# Patient Record
Sex: Female | Born: 1965
Health system: Southern US, Community
[De-identification: ages and names within clinical notes are randomized; demographics above are authoritative.]

## PROBLEM LIST (undated history)

## (undated) DIAGNOSIS — F32A Depression, unspecified: Secondary | ICD-10-CM

## (undated) DIAGNOSIS — M2550 Pain in unspecified joint: Secondary | ICD-10-CM

## (undated) DIAGNOSIS — M549 Dorsalgia, unspecified: Secondary | ICD-10-CM

## (undated) DIAGNOSIS — F419 Anxiety disorder, unspecified: Secondary | ICD-10-CM

## (undated) DIAGNOSIS — F329 Major depressive disorder, single episode, unspecified: Secondary | ICD-10-CM

## (undated) DIAGNOSIS — R609 Edema, unspecified: Secondary | ICD-10-CM

## (undated) HISTORY — DX: Edema, unspecified: R60.9

## (undated) HISTORY — PX: CYST REMOVAL HAND: SHX6279

## (undated) HISTORY — PX: LAPAROSCOPY: SHX197

## (undated) HISTORY — DX: Anxiety disorder, unspecified: F41.9

## (undated) HISTORY — DX: Depression, unspecified: F32.A

## (undated) HISTORY — DX: Pain in unspecified joint: M25.50

## (undated) HISTORY — DX: Major depressive disorder, single episode, unspecified: F32.9

## (undated) HISTORY — PX: ANKLE SURGERY: SHX546

## (undated) HISTORY — PX: TONSILLECTOMY AND ADENOIDECTOMY: SHX28

## (undated) HISTORY — DX: Dorsalgia, unspecified: M54.9

---

## 1999-02-18 ENCOUNTER — Other Ambulatory Visit: Admission: RE | Admit: 1999-02-18 | Discharge: 1999-02-18 | Payer: Self-pay | Admitting: Family Medicine

## 2005-05-25 ENCOUNTER — Emergency Department (HOSPITAL_COMMUNITY): Admission: EM | Admit: 2005-05-25 | Discharge: 2005-05-25 | Payer: Self-pay | Admitting: Emergency Medicine

## 2007-10-20 ENCOUNTER — Ambulatory Visit: Payer: Self-pay | Admitting: Obstetrics & Gynecology

## 2007-10-20 ENCOUNTER — Encounter: Payer: Self-pay | Admitting: Obstetrics & Gynecology

## 2007-11-15 ENCOUNTER — Ambulatory Visit: Payer: Self-pay | Admitting: Family Medicine

## 2007-11-15 DIAGNOSIS — J069 Acute upper respiratory infection, unspecified: Secondary | ICD-10-CM | POA: Insufficient documentation

## 2007-12-08 ENCOUNTER — Encounter: Admission: RE | Admit: 2007-12-08 | Discharge: 2007-12-08 | Payer: Self-pay | Admitting: Family Medicine

## 2008-10-24 ENCOUNTER — Other Ambulatory Visit: Admission: RE | Admit: 2008-10-24 | Discharge: 2008-10-24 | Payer: Self-pay | Admitting: Family Medicine

## 2008-10-24 ENCOUNTER — Ambulatory Visit: Payer: Self-pay | Admitting: Family Medicine

## 2008-10-24 ENCOUNTER — Encounter: Payer: Self-pay | Admitting: Family Medicine

## 2008-10-24 DIAGNOSIS — R5381 Other malaise: Secondary | ICD-10-CM | POA: Insufficient documentation

## 2008-10-24 DIAGNOSIS — R5383 Other fatigue: Secondary | ICD-10-CM

## 2008-10-24 DIAGNOSIS — L820 Inflamed seborrheic keratosis: Secondary | ICD-10-CM | POA: Insufficient documentation

## 2008-10-24 LAB — CONVERTED CEMR LAB
ALT: 11 units/L (ref 0–35)
AST: 19 units/L (ref 0–37)
Cholesterol: 155 mg/dL (ref 0–200)
Creatinine, Ser: 0.8 mg/dL (ref 0.40–1.20)
HDL: 39 mg/dL — ABNORMAL LOW (ref >39)
LDL Cholesterol: 99 mg/dL (ref 0–99)
MCHC: 32.6 g/dL (ref 30.0–36.0)
MCV: 92 fL (ref 78.0–100.0)
Platelets: 252 10*3/uL (ref 150–400)
RDW: 13.7 % (ref 11.5–15.5)
Sodium: 140 meq/L (ref 135–145)
TSH: 2.142 microintl units/mL (ref 0.350–4.50)
Total Bilirubin: 0.6 mg/dL (ref 0.3–1.2)
Total CHOL/HDL Ratio: 4
Total Protein: 6.8 g/dL (ref 6.0–8.3)
Triglycerides: 84 mg/dL (ref <150)
VLDL: 17 mg/dL (ref 0–40)

## 2008-10-25 ENCOUNTER — Encounter: Payer: Self-pay | Admitting: Family Medicine

## 2008-11-10 ENCOUNTER — Ambulatory Visit: Payer: Self-pay | Admitting: Family Medicine

## 2008-11-10 ENCOUNTER — Encounter: Payer: Self-pay | Admitting: Family Medicine

## 2008-11-10 DIAGNOSIS — D239 Other benign neoplasm of skin, unspecified: Secondary | ICD-10-CM | POA: Insufficient documentation

## 2009-01-09 ENCOUNTER — Ambulatory Visit: Payer: Self-pay | Admitting: Family Medicine

## 2009-01-09 ENCOUNTER — Encounter: Admission: RE | Admit: 2009-01-09 | Discharge: 2009-01-09 | Payer: Self-pay | Admitting: Family Medicine

## 2009-01-09 DIAGNOSIS — S335XXA Sprain of ligaments of lumbar spine, initial encounter: Secondary | ICD-10-CM | POA: Insufficient documentation

## 2009-12-03 ENCOUNTER — Telehealth: Payer: Self-pay | Admitting: Family Medicine

## 2009-12-03 ENCOUNTER — Other Ambulatory Visit: Admission: RE | Admit: 2009-12-03 | Discharge: 2009-12-03 | Payer: Self-pay | Admitting: Family Medicine

## 2009-12-03 ENCOUNTER — Ambulatory Visit: Payer: Self-pay | Admitting: Family Medicine

## 2009-12-03 DIAGNOSIS — R635 Abnormal weight gain: Secondary | ICD-10-CM | POA: Insufficient documentation

## 2009-12-03 DIAGNOSIS — N912 Amenorrhea, unspecified: Secondary | ICD-10-CM | POA: Insufficient documentation

## 2009-12-04 LAB — CONVERTED CEMR LAB
ALT: 13 units/L (ref 0–35)
Albumin: 4.4 g/dL (ref 3.5–5.2)
Alkaline Phosphatase: 44 units/L (ref 39–117)
Calcium: 8.9 mg/dL (ref 8.4–10.5)
Cholesterol: 199 mg/dL (ref 0–200)
LDL Cholesterol: 130 mg/dL — ABNORMAL HIGH (ref 0–99)
Potassium: 3.9 meq/L (ref 3.5–5.3)
TSH: 1.99 microintl units/mL (ref 0.350–4.500)
Total CHOL/HDL Ratio: 4.7
Total Protein: 7.2 g/dL (ref 6.0–8.3)
VLDL: 27 mg/dL (ref 0–40)

## 2010-01-14 ENCOUNTER — Encounter: Admission: RE | Admit: 2010-01-14 | Discharge: 2010-01-14 | Payer: Self-pay | Admitting: Family Medicine

## 2010-11-12 NOTE — Assessment & Plan Note (Signed)
Summary: CPE with pap   Vital Signs:  Patient profile:   45 year old female Menstrual status:  irregular LMP:     10/22/2009 Height:      64.25 inches Weight:      187 pounds BMI:     31.96 O2 Sat:      99 % on Room air Temp:     98.4 degrees F oral Pulse rate:   93 / minute BP sitting:   123 / 78  (left arm) Cuff size:   large  Vitals Entered By: Payton Spark CMA (December 03, 2009 8:29 AM)  O2 Flow:  Room air CC: CPE w/ pap LMP (date): 10/22/2009     Menstrual Status irregular Enter LMP: 10/22/2009 Last PAP Result NEGATIVE FOR INTRAEPITHELIAL LESIONS OR MALIGNANCY.   Primary Care Provider:  Seymour Bars DO  CC:  CPE w/ pap.  History of Present Illness: 45 yo WF presents for CPE with pap smear.  She has not been exercising since having to take care of her nephew in the afternoons.  She has gained 10 lbs.  She has her mammogram scheduled for April.  Her periods are a bit irregular -- occuring every 6 wks.  She is having vasomotor flushing.  Her cramps have been worse.  She is not on any contraception.  Her fasting labs are due.  Tetanus vaccine is UTD.   Married, nulligravid.       Allergies (verified): 1)  ! Codeine 2)  ! Pcn 3)  ! * Oxycontin 4)  ! Sulfa  Past History:  Past Medical History: Reviewed history from 10/24/2008 and no changes required. Unremarkable G0  Past Surgical History: Reviewed history from 11/15/2007 and no changes required. ankle surgery 2002  Family History: father CAD at 27, mother DM, high chol, HTN uncle with breast cancer, aunts with breast cancer brother healthy MGF colon cancer in his 66s Puncle with colon cancer  Social History: Reviewed history from 11/15/2007 and no changes required. 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 Has no children. Never smoked. Denies ETOH. Exercises 3 days a wk.    Review of Systems       The patient complains of weight gain.  The patient denies anorexia, fever, weight loss,  vision loss, decreased hearing, hoarseness, chest pain, syncope, dyspnea on exertion, peripheral edema, prolonged cough, headaches, hemoptysis, abdominal pain, melena, hematochezia, severe indigestion/heartburn, hematuria, incontinence, genital sores, muscle weakness, suspicious skin lesions, transient blindness, difficulty walking, depression, unusual weight change, abnormal bleeding, enlarged lymph nodes, angioedema, breast masses, and testicular masses.    Physical Exam  General:  alert, well-developed, well-nourished, and well-hydrated.  obese Head:  normocephalic and atraumatic.   Eyes:  PERRLA Ears:  clear effusion on the R, o/w both TMs normal Nose:  no nasal discharge.   Mouth:  good dentition and pharynx pink and moist.   Neck:  no masses.   Breasts:  No mass, nodules, thickening, tenderness, bulging, retraction, inflamation, nipple discharge or skin changes noted.   Lungs:  Normal respiratory effort, chest expands symmetrically. Lungs are clear to auscultation, no crackles or wheezes. Heart:  Normal rate and regular rhythm. S1 and S2 normal without gallop, murmur, click, rub or other extra sounds. Abdomen:  Bowel sounds positive,abdomen soft and non-tender without masses, organomegaly or hernias noted. Genitalia:  Pelvic Exam:        External: normal female genitalia without lesions or masses        Vagina: normal without lesions or  masses        Cervix: normal without lesions or masses, + nabothian cysts        Adnexa:grossly normal but unable to palpate specific parts due to body habitus.          Uterus: normal by palpation        Pap smear: performed Pulses:  2+ pedal and radial pulses Extremities:  no E/C/C Skin:  color normal.   Cervical Nodes:  No lymphadenopathy noted Psych:  good eye contact, not anxious appearing, and not depressed appearing.     Impression & Recommendations:  Problem # 1:  ROUTINE GYNECOLOGICAL EXAMINATION (ICD-V72.31) Thin prep pap smear  done. Keeping healthy checklist for women reviewed. BP at goal.  BMI 31 c/w class I obesity. U preg done for secondary amenorrhea --> neg, c/w perimenopausal status. GYN # given to discuss options for sterilization. Update fasting labs. Immunizations UTD. Work on [employer], regular exercise, wt loss. Mammogram scheduled for April.  Complete Medication List: 1)  Zyrtec Allergy 10 Mg Tabs (Cetirizine hcl) .... Take 1 tablet by mouth once a day  Other Orders: T-Comprehensive Metabolic Panel [phone]) T-Lipid Profile ([id number]) T-TSH [phone]) Urine Pregnancy ([id number])  Patient Instructions: 1)  Center for [facility]: 2)  [phone] 3)  Fasting labs today. 4)  Will call you w/ results tomorrow. 5)  Work on [employer], regular exercise. 6)  Return for f/u in 1 yr, sooner if needed.  Laboratory Results   Urine Tests      Urine HCG: negative

## 2010-11-12 NOTE — Progress Notes (Signed)
Summary: Nasal spray  Phone Note Call from Patient   Caller: Patient Summary of Call: Pt LMOM stating you mentioned a nasal spray you were going to send to pharm. Please advise Initial call taken by: Payton Spark CMA,  December 03, 2009 2:21 PM    New/Updated Medications: NASONEX 50 MCG/ACT SUSP (MOMETASONE FUROATE) 2 sprays per nostril daily Prescriptions: NASONEX 50 MCG/ACT SUSP (MOMETASONE FUROATE) 2 sprays per nostril daily  #1 bottle x 2   Entered and Authorized by:   Seymour Bars DO   Signed by:   Seymour Bars DO on 12/03/2009   Method used:   Electronically to        [facility]* (retail)       [address] [place], [place]  [address]       Ph: [phone]       Fax: [phone]   RxID:   [id number]   Appended Document: Nasal spray LMOM informing Pt

## 2011-02-25 NOTE — Assessment & Plan Note (Signed)
NAMEKIOSHA, BUCHAN NO.:  [id number]   MEDICAL RECORD NO.:  [id number]          PATIENT TYPE:  POB   LOCATION:  [facility] at [place]         FACILITY:  [facility]   PHYSICIAN:  Allie Bossier, MD        DATE OF BIRTH:  16-Jun-1966   DATE OF SERVICE:                                  CLINIC NOTE   SUBJECTIVE:  Ms. Pinnix is a 45 year old married, white gravida 0.  She was here for her annual exam.  She complains of heavy periods that  last 7 days total, 4 days are the heaviest.  Her cycles are every 14-20  days.  This is not unusual for her.  She reports that she has declined  to give blood because of anemia in the past.   PAST MEDICAL HISTORY:  1. Endometriosis.  2. Primary infertility.  3. Menorrhagia.  4. Obesity.   REVIEW OF SYSTEMS:  Cycles, as above.  Her husband uses condoms when  they have intercourse.  She has been married  for 22 years without  dyspareunia.  Her husband works out of town for [employer] and is gone for 6  weeks at a time.  Her mammogram was done in 2008 and it was normal.  Her  Pap smear was 09/2006 and it was normal.   PAST SURGICAL HISTORY:  Laparoscopy '95, along with a DNC.  A left ankle  surgery status post trauma with ORIF.   ALLERGIES:  No latex allergies.   MEDICATIONS ALLERGIES:  1. Penicillin.  2. Sulfa.  3. Codeine.   SOCIAL HISTORY:  She denies smoking or illegal drugs.  She rarely drinks  alcohol.   FAMILY HISTORY:  Significant for colon cancer in a maternal grandfather  and breast cancer in 2 maternal aunt's and 1 paternal aunt.   PHYSICAL EXAMINATION:  VITAL SIGNS:  Weight 175 pounds, height 5, 2.  BMI 32.  Blood pressure 126/80, pulse 77, temperature 97.5.  HEENT:  Normal.  HEART:  Regular rate and rhythm.  LUNGS:  Clear to auscultation bilaterally.  BREASTS:  Exam normal.  No masses or nipple discharge or skin changes.  ABDOMINAL:  Benign.  EXTERNAL GENITALIA:  Normal.  No lesions.  Cervix:  No leprous, no  lesions.   Normal discharge.  Uterus:  Normal size and shape,  retroverted.  Minimally mobile, nontender.  Adnexa nontender.  No  enlargement.   ASSESSMENT/PLAN:  1. Annual exam.  I have checked a Pap smear and run cervical cultures      with this per [employer] [facility]      recommendations.  She will have a mammogram today.  Recommended      self-breasts and self-vulvar exams monthly.  2. Obesity.  We decreased weight loss and exercise.  I will check a      fasting lipid profile and a fasting blood sugar today.  3. With menorrhagia will check a TSH and a CBC.   Please note, she has an appointment with family practice doctor, Dr.  Cathey Endow, in several weeks.      Allie Bossier, MD     MCD/MEDQ  D:  10/20/2007  T:  10/20/2007  Job:  [id number]

## 2011-12-16 ENCOUNTER — Other Ambulatory Visit: Payer: Self-pay | Admitting: Obstetrics and Gynecology

## 2013-09-12 ENCOUNTER — Other Ambulatory Visit: Payer: Self-pay | Admitting: Obstetrics and Gynecology

## 2014-09-15 ENCOUNTER — Other Ambulatory Visit: Payer: Self-pay | Admitting: Obstetrics and Gynecology

## 2014-09-18 LAB — CYTOLOGY - PAP

## 2014-09-19 ENCOUNTER — Other Ambulatory Visit: Payer: Self-pay | Admitting: Obstetrics and Gynecology

## 2014-09-19 DIAGNOSIS — R928 Other abnormal and inconclusive findings on diagnostic imaging of breast: Secondary | ICD-10-CM

## 2014-10-10 ENCOUNTER — Ambulatory Visit
Admission: RE | Admit: 2014-10-10 | Discharge: 2014-10-10 | Disposition: A | Discharge status: Home / Self Care | Payer: BC Managed Care – PPO | Source: Ambulatory Visit | Attending: Obstetrics and Gynecology | Admitting: Obstetrics and Gynecology

## 2014-10-10 ENCOUNTER — Encounter (INDEPENDENT_AMBULATORY_CARE_PROVIDER_SITE_OTHER): Payer: Self-pay

## 2014-10-10 DIAGNOSIS — R928 Other abnormal and inconclusive findings on diagnostic imaging of breast: Secondary | ICD-10-CM

## 2015-09-30 IMAGING — US US BREAST*L* LIMITED INC AXILLA
1 series · 11 of 11 positions shown · non-contrast
Comparison: Previous examinations, including the screening
mammogram dated 09/15/2014.

CLINICAL DATA: Possible upper left breast mass at recent screening
mammography. The patient began taking estrogen 1 year ago.

EXAM:
DIGITAL DIAGNOSTIC  LEFT MAMMOGRAM
ULTRASOUND LEFT BREAST

[Series 1: us breast*left* limited inc axilla · 11 of 11 slices shown]
[im 1/11]
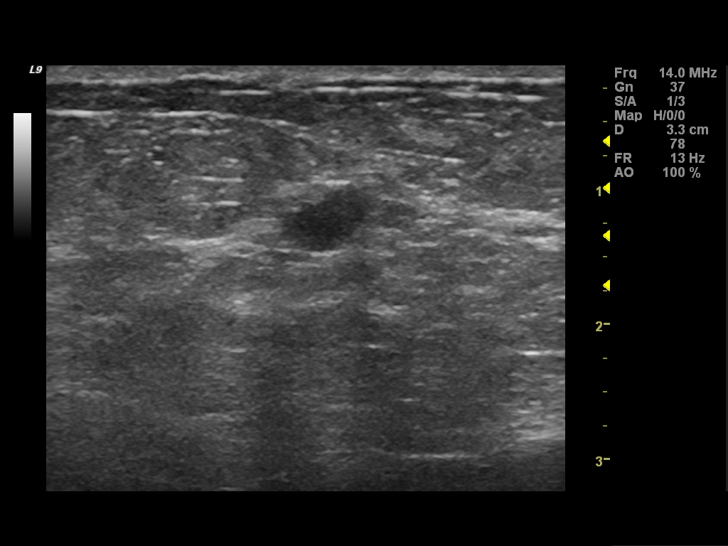
[im 2/11]
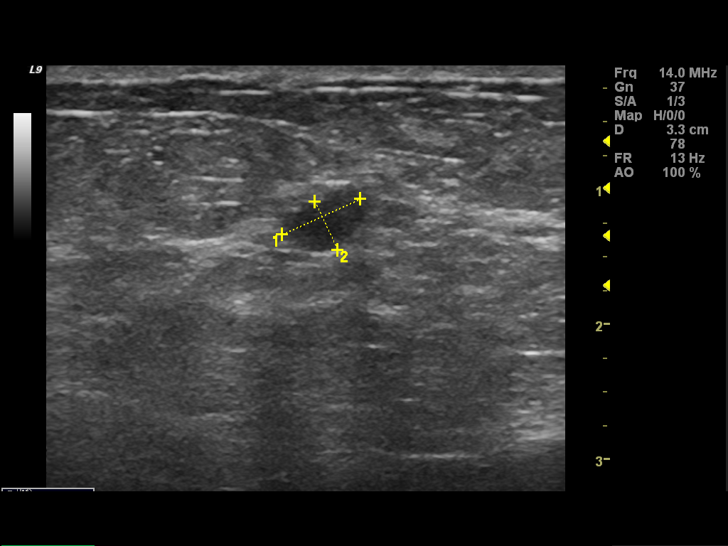
[im 3/11]
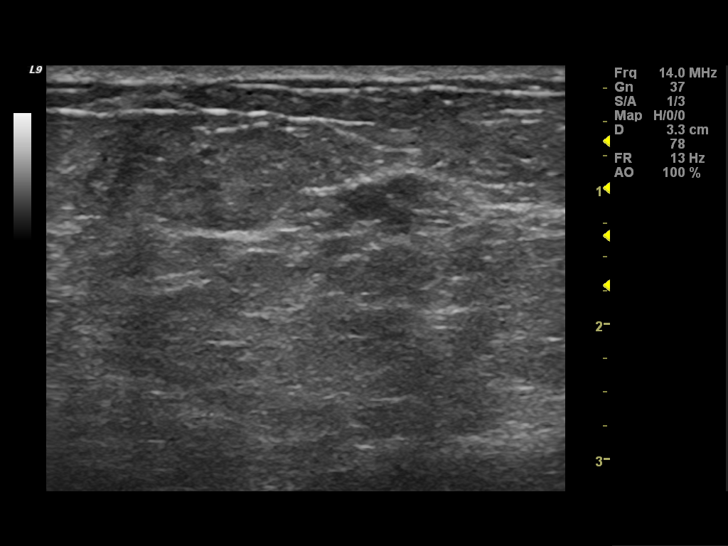
[im 4/11]
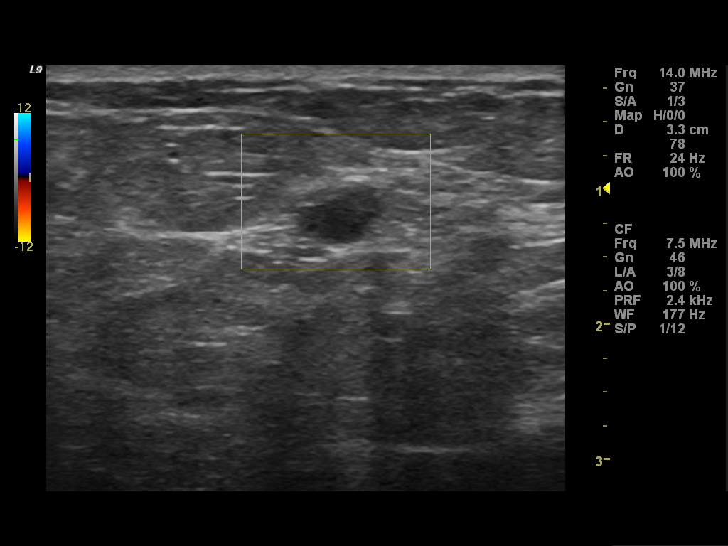
[im 5/11]
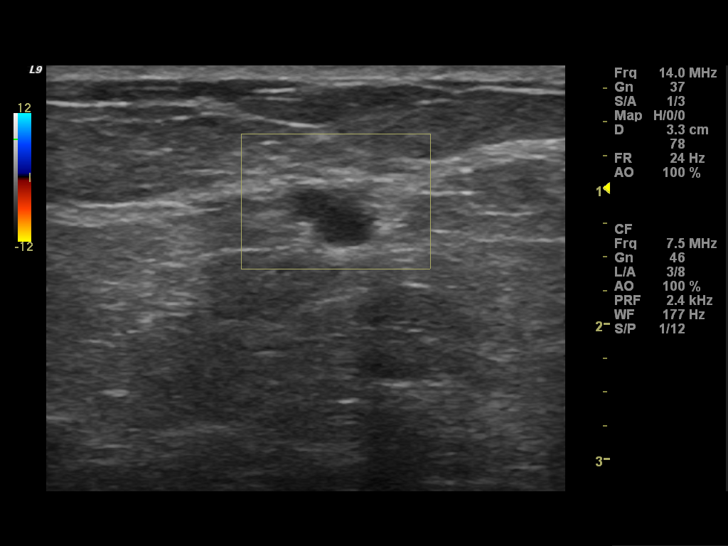
[im 6/11]
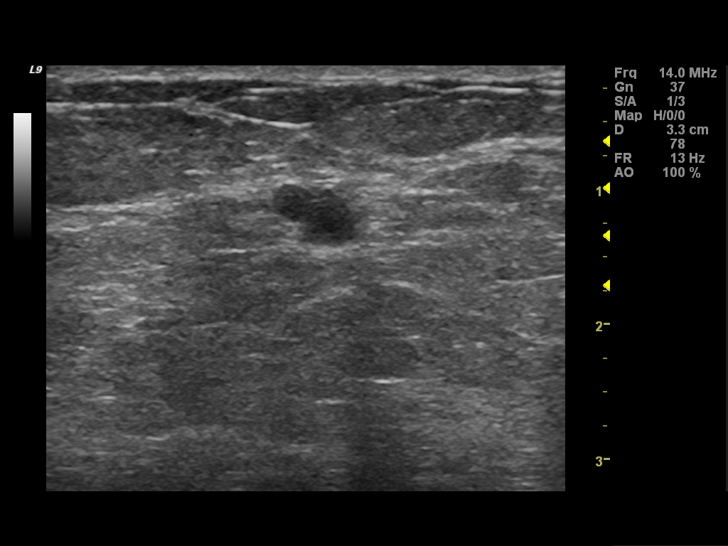
[im 7/11]
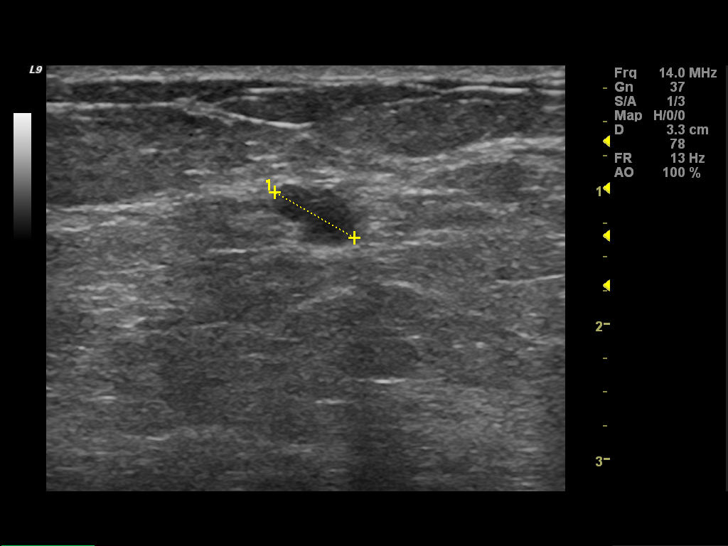
[im 8/11]
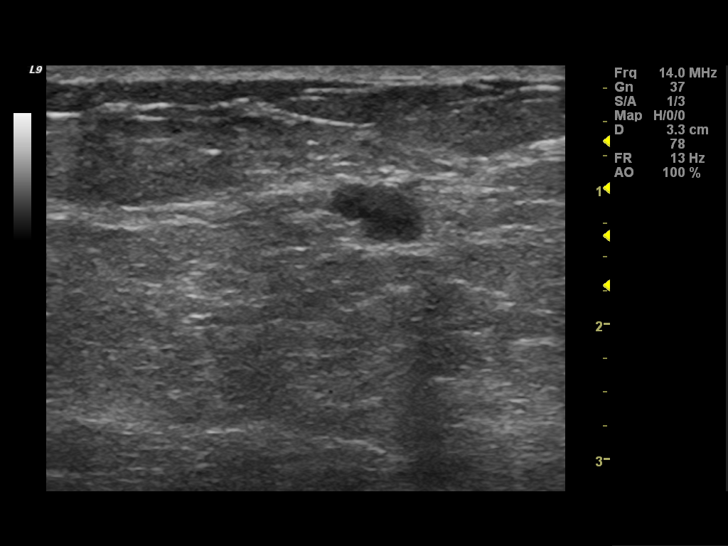
[im 9/11]
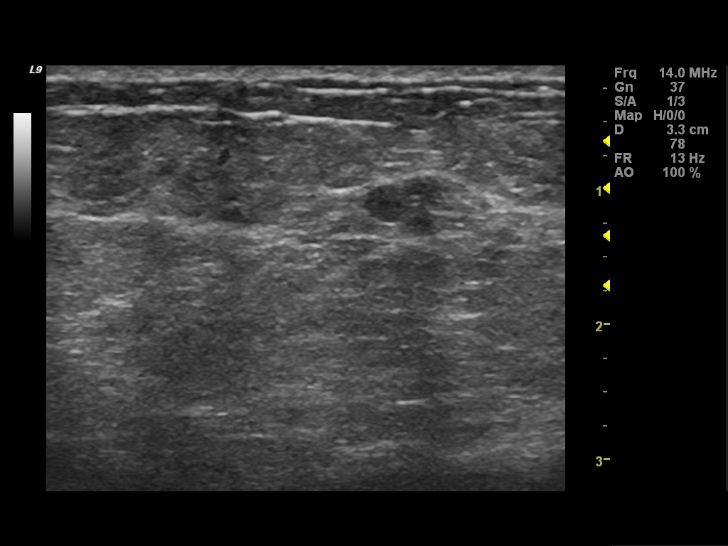
[im 10/11]
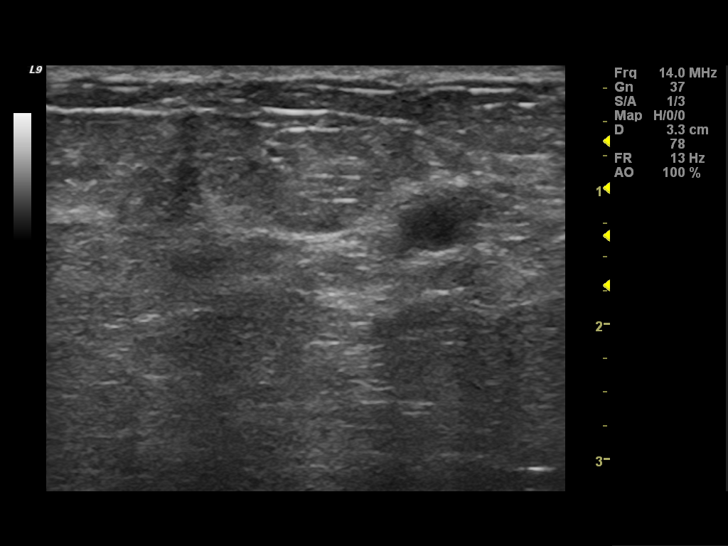
[im 11/11]
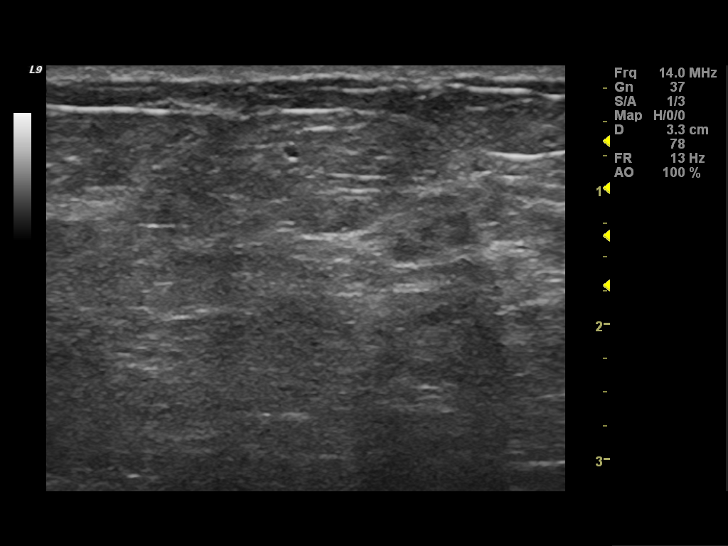

[11 of 11 positions shown; findings below may reference images not displayed]

ACR Breast Density Category b: There are scattered areas of
fibroglandular density.
FINDINGS: Spot compression views of the left breast confirm an oval,
circumscribed mass in the posterior aspect of the upper left breast
in the 12 o'clock position.

On physical exam, no mass is palpable in the upper left breast.

Ultrasound is performed, showing a 7 mm trilobed cyst in the 11:30
o'clock position of the left breast, 7 cm from the nipple.
IMPRESSION: Left breast cyst.  No evidence of malignancy.

RECOMMENDATION:
Bilateral screening mammogram in 1 year.

I have discussed the findings and recommendations with the patient.
Results were also provided in writing at the conclusion of the
visit. If applicable, a reminder letter will be sent to the patient
regarding the next appointment.

BI-RADS CATEGORY  2: Benign.

## 2016-05-07 DIAGNOSIS — L7211 Pilar cyst: Secondary | ICD-10-CM | POA: Diagnosis not present

## 2016-05-07 DIAGNOSIS — D1801 Hemangioma of skin and subcutaneous tissue: Secondary | ICD-10-CM | POA: Diagnosis not present

## 2016-05-07 DIAGNOSIS — L723 Sebaceous cyst: Secondary | ICD-10-CM | POA: Diagnosis not present

## 2016-05-07 DIAGNOSIS — L821 Other seborrheic keratosis: Secondary | ICD-10-CM | POA: Diagnosis not present

## 2016-05-07 DIAGNOSIS — L918 Other hypertrophic disorders of the skin: Secondary | ICD-10-CM | POA: Diagnosis not present

## 2016-09-18 ENCOUNTER — Encounter (INDEPENDENT_AMBULATORY_CARE_PROVIDER_SITE_OTHER): Payer: Self-pay | Admitting: Family Medicine

## 2016-10-09 ENCOUNTER — Encounter (INDEPENDENT_AMBULATORY_CARE_PROVIDER_SITE_OTHER): Payer: Self-pay | Admitting: Family Medicine

## 2016-10-09 ENCOUNTER — Ambulatory Visit (INDEPENDENT_AMBULATORY_CARE_PROVIDER_SITE_OTHER): Payer: 59 | Admitting: Family Medicine

## 2016-10-09 VITALS — BP 149/89 | HR 81 | Temp 98.3°F | Resp 13 | Ht 63.5 in | Wt 205.0 lb

## 2016-10-09 DIAGNOSIS — R5383 Other fatigue: Secondary | ICD-10-CM | POA: Diagnosis not present

## 2016-10-09 DIAGNOSIS — R0602 Shortness of breath: Secondary | ICD-10-CM

## 2016-10-09 DIAGNOSIS — Z1331 Encounter for screening for depression: Secondary | ICD-10-CM

## 2016-10-09 DIAGNOSIS — R030 Elevated blood-pressure reading, without diagnosis of hypertension: Secondary | ICD-10-CM

## 2016-10-09 DIAGNOSIS — Z6836 Body mass index (BMI) 36.0-36.9, adult: Secondary | ICD-10-CM | POA: Diagnosis not present

## 2016-10-09 DIAGNOSIS — E669 Obesity, unspecified: Secondary | ICD-10-CM

## 2016-10-09 DIAGNOSIS — Z1389 Encounter for screening for other disorder: Secondary | ICD-10-CM | POA: Diagnosis not present

## 2016-10-09 DIAGNOSIS — Z9189 Other specified personal risk factors, not elsewhere classified: Secondary | ICD-10-CM | POA: Diagnosis not present

## 2016-10-09 DIAGNOSIS — Z0289 Encounter for other administrative examinations: Secondary | ICD-10-CM

## 2016-10-09 NOTE — Progress Notes (Signed)
Office: [phone]  /  Fax: [phone]   HPI:   Chief Complaint: OBESITY  Kristi Huynh (MR# ID:[id number]) is a 50 y.o. female who presents on 10/09/2016 for obesity evaluation and treatment. Current BMI is Body mass index is 35.74 kg/m.Kristi Huynh [name] has struggled with obesity for years and has been unsuccessful in either losing weight or maintaining long term weight loss. Kristi Huynh attended our information session and states she is currently in the action stage of change and ready to dedicate time achieving and maintaining a healthier weight.  Kristi Huynh states her family eats meals together she thinks her family will eat healthier with  her her desired weight is 130 she started gaining weight 2 years after no menses her heaviest weight ever was 209 lbs. she has significant food cravings issues  she snacks frequently in the evenings she frequently makes poor food choices she frequently eats larger portions than normal  she has binge eating behaviors she struggles with emotional eating    Fatigue Kristi Huynh feels her energy is lower than it should be. This has worsened with weight gain and has not worsened recently. Kristi Huynh admits to daytime somnolence and  admits to waking up still tired. Patient is at risk for obstructive sleep apnea. Patent has a history of symptoms of daytime fatigue and Epworth sleepiness scale. Patient generally gets 7 or 8 hours of sleep per night, and states they generally have restless sleep. Snoring is present. Apneic episodes are not present. Epworth Sleepiness Score is 11  Dyspnea on exertion Kristi Huynh notes increasing shortness of breath with exercising and seems to be worsening over time with weight gain. She notes getting out of breath sooner with activity than she used to. This has not gotten worse recently. Kristi Huynh denies orthopnea.  Elevated Blood Pressure Kristi Huynh has elevated blood pressure today, no history of hypertension, denies chest pain. She feels her BP may be increasing  with her weight gain.  Depression Screen [name] Food and Mood (modified PHQ-9) score was  Depression screen PHQ 2/9 10/09/2016  Decreased Interest 3  Down, Depressed, Hopeless 2  PHQ - 2 Score 5  Altered sleeping 2  Tired, decreased energy 3  Change in appetite 3  Feeling bad or failure about yourself  2  Trouble concentrating 2  Moving slowly or fidgety/restless 1  Suicidal thoughts 0  PHQ-9 Score 18    ALLERGIES: Allergies  Allergen Reactions  . Codeine   . Oxycodone Hcl   . Penicillins   . Sulfonamide Derivatives     MEDICATIONS: No current outpatient prescriptions on file prior to visit.   No current facility-administered medications on file prior to visit.     PAST MEDICAL HISTORY: Past Medical History:  Diagnosis Date  . Anxiety   . back pain   . Depression   . joint pain   . Swelling    feet and legs    PAST SURGICAL HISTORY: Past Surgical History:  Procedure Laterality Date  . ANKLE SURGERY     Right 2 times, once as teen and once at age 34  . CYST REMOVAL HAND     Right hand age 28 and 10  . LAPAROSCOPY     endometrosis  . TONSILLECTOMY AND ADENOIDECTOMY     50 years old    SOCIAL HISTORY: Social History  Substance Use Topics  . Smoking status: Never Smoker  . Smokeless tobacco: Never Used  . Alcohol use Not on file    FAMILY HISTORY: Family History  Problem  Relation Age of Onset  . Diabetes Mother   . Hypertension Mother   . Hyperlipidemia Mother   . Thyroid disease Mother   . Depression Mother   . Anxiety disorder Mother   . Eating disorder Mother   . Obesity Mother   . Heart disease Father   . Sudden death Father     ROS: Review of Systems  Constitutional: Positive for malaise/fatigue.  HENT: Positive for sinus pain.   Eyes: Positive for redness.       Wear Glasses or contacts  Respiratory: Positive for cough and wheezing.   Cardiovascular:       Calf/Leg Pain with Walking  Musculoskeletal: Positive for back pain,  joint pain and myalgias.  Psychiatric/Behavioral: The patient has insomnia.        Stress    PHYSICAL EXAM: Blood pressure (!) 149/89, pulse 81, temperature 98.3 F (36.8 C), temperature source Oral, resp. rate 13, height 5' 3.5" (1.613 m), weight 205 lb (93 kg), last menstrual period 09/12/2014, SpO2 96 %. Body mass index is 35.74 kg/m. Physical Exam  Constitutional: She is oriented to person, place, and time. She appears well-developed and well-nourished.  HENT:  Head: Normocephalic and atraumatic.  Nose: Nose normal.  Eyes: EOM are normal. No scleral icterus.  Neck: Normal range of motion. Neck supple. No thyromegaly present.  Pulmonary/Chest: No respiratory distress.  Abdominal: Soft. There is no tenderness.  Obesity  Musculoskeletal: Normal range of motion. She exhibits edema.  Neurological: She is alert and oriented to person, place, and time.  Skin: Skin is warm and dry.  Psychiatric: She has a normal mood and affect. Her behavior is normal.    RECENT LABS AND TESTS: BMET    Component Value Date/Time   NA 140 12/03/2009 2056   K 3.9 12/03/2009 2056   CL 104 12/03/2009 2056   CO2 25 12/03/2009 2056   GLUCOSE 93 12/03/2009 2056   BUN 11 12/03/2009 2056   CREATININE 0.76 12/03/2009 2056   CALCIUM 8.9 12/03/2009 2056   No results found for: HGBA1C No results found for: INSULIN CBC    Component Value Date/Time   WBC 6.2 10/24/2008 2117   RBC 4.64 10/24/2008 2117   HGB 13.9 10/24/2008 2117   HCT 42.7 10/24/2008 2117   PLT 252 10/24/2008 2117   MCV 92.0 10/24/2008 2117   MCHC 32.6 10/24/2008 2117   RDW 13.7 10/24/2008 2117   Iron/TIBC/Ferritin/ %Sat No results found for: IRON, TIBC, FERRITIN, IRONPCTSAT Lipid Panel     Component Value Date/Time   CHOL 199 12/03/2009 2056   TRIG 134 12/03/2009 2056   HDL 42 12/03/2009 2056   CHOLHDL 4.7 Ratio 12/03/2009 2056   VLDL 27 12/03/2009 2056   LDLCALC 130 (H) 12/03/2009 2056   Hepatic Function Panel       Component Value Date/Time   PROT 7.2 12/03/2009 2056   ALBUMIN 4.4 12/03/2009 2056   AST 23 12/03/2009 2056   ALT 13 12/03/2009 2056   ALKPHOS 44 12/03/2009 2056   BILITOT 0.6 12/03/2009 2056      Component Value Date/Time   TSH 1.990 12/03/2009 2056   TSH 2.142 10/24/2008 2117    ECG  shows NSR with a rate of 81 BPM INDIRECT CALORIMETER done today shows a VO2 of 233 and a REE of 1623.    ASSESSMENT AND PLAN: Other fatigue - Plan: EKG 12-Lead, Vitamin B12, Comprehensive metabolic panel, Hemoglobin A1c, Insulin, random, T4, free, T3, TSH, VITAMIN D 25 Hydroxy (Vit-D  Deficiency, Fractures), Folate, Lipid Panel With LDL/HDL Ratio, CBC With Differential  Shortness of breath on exertion - Plan: Lipid Panel With LDL/HDL Ratio  Elevated blood pressure reading without diagnosis of hypertension  At risk for heart disease  Depression screening  Class 2 obesity without serious comorbidity with body mass index (BMI) of 36.0 to 36.9 in adult, unspecified obesity type  PLAN:  Fatigue Jaielle was informed that her fatigue may be related to obesity, depression or many other causes. Labs will be ordered, and in the meanwhile Alonie has agreed to work on diet, exercise and weight loss to help with fatigue. Proper sleep hygiene was discussed including the need for 7-8 hours of quality sleep each night. A sleep study was not ordered based on symptoms and Epworth score.  Exercise Induced Shortness of Breath Milena's shortness of breath appears to be obesity related and exercise induced. She has agreed to work on weight loss and gradually increase exercise to treat her exercise induced shortness of breath. If Naveh follows our instructions and loses weight without improvement of her shortness of breath, we will plan to refer to pulmonology. We will monitor this condition regularly. Rachella agrees to this plan.  Elevated Blood Pressure  Kimora agrees to check blood pressure at home daily and bring in  log to next visit.  Cardiovascular risk counselling Vannie was given extended (at least 15 minutes) coronary artery disease prevention counseling today. She is 50 y.o. female and has risk factors for heart disease including obesity. We discussed intensive lifestyle modifications today with an emphasis on specific weight loss instructions and strategies. Pt was also informed of the importance of increasing exercise and decreasing saturated fats to help prevent heart disease.  Depression Screen Syah had a positive depression screening. Depression is commonly associated with obesity and often results in emotional eating behaviors. We will monitor this closely and work on CBT to help improve the non-hunger eating patterns. Referral to Psychology may be required if no improvement is seen as she continues in our clinic.  Obesity Taeya is currently in the action stage of change and her goal is to continue with weight loss efforts She has agreed to follow the Category 2 plan +100 Calories. Trinh has been instructed to work up to a goal of 150 minutes of combined cardio and strengthening exercise per week for weight loss and overall health benefits. We discussed the following Behavioral Modification Stratagies today: increasing lean protein intake, decreasing sodium intake, work on meal planning and easy cooking plans and emotional eating strategies  Kinzlie has agreed to follow up with our clinic in 2 weeks. She was informed of the importance of frequent follow up visits to maximize her success with intensive lifestyle modifications for her multiple health conditions. She was informed we would discuss her lab results at her next visit unless there is a critical issue that needs to be addressed sooner. Damani agreed to keep her next visit at the agreed upon time to discuss these results.  I, Doreene Nest, am acting as scribe for Dennard Nip, MD  I have reviewed the above documentation for accuracy and  completeness, and I agree with the above. -Dennard Nip, MD

## 2016-10-10 LAB — FOLATE: Folate: >20 ng/mL (ref >3.0)

## 2016-10-10 LAB — HEMOGLOBIN A1C
Est. average glucose Bld gHb Est-mCnc: 108 mg/dL
HEMOGLOBIN A1C: 5.4 % (ref 4.8–5.6)

## 2016-10-10 LAB — T4, FREE: FREE T4: 1.09 ng/dL (ref 0.82–1.77)

## 2016-10-10 LAB — COMPREHENSIVE METABOLIC PANEL
ALBUMIN: 4.5 g/dL (ref 3.5–5.5)
ALT: 10 IU/L (ref 0–32)
AST: 22 IU/L (ref 0–40)
Albumin/Globulin Ratio: 1.3 (ref 1.2–2.2)
Alkaline Phosphatase: 63 IU/L (ref 39–117)
BUN / CREAT RATIO: 10 (ref 9–23)
BUN: 8 mg/dL (ref 6–24)
Bilirubin Total: 0.5 mg/dL (ref 0.0–1.2)
CALCIUM: 9.3 mg/dL (ref 8.7–10.2)
CO2: 23 mmol/L (ref 18–29)
CREATININE: 0.81 mg/dL (ref 0.57–1.00)
Chloride: 98 mmol/L (ref 96–106)
GFR calc non Af Amer: 84 mL/min/{1.73_m2} (ref >59)
GFR, EST AFRICAN AMERICAN: 97 mL/min/{1.73_m2} (ref >59)
GLUCOSE: 88 mg/dL (ref 65–99)
Globulin, Total: 3.4 g/dL (ref 1.5–4.5)
Potassium: 4.1 mmol/L (ref 3.5–5.2)
Sodium: 139 mmol/L (ref 134–144)
TOTAL PROTEIN: 7.9 g/dL (ref 6.0–8.5)

## 2016-10-10 LAB — CBC WITH DIFFERENTIAL
BASOS ABS: 0.1 10*3/uL (ref 0.0–0.2)
BASOS: 1 %
EOS (ABSOLUTE): 0.1 10*3/uL (ref 0.0–0.4)
Eos: 1 %
Hematocrit: 42.4 % (ref 34.0–46.6)
Hemoglobin: 14.4 g/dL (ref 11.1–15.9)
Immature Grans (Abs): 0 10*3/uL (ref 0.0–0.1)
Immature Granulocytes: 0 %
LYMPHS ABS: 2.8 10*3/uL (ref 0.7–3.1)
Lymphs: 32 %
MCH: 29.8 pg (ref 26.6–33.0)
MCHC: 34 g/dL (ref 31.5–35.7)
MCV: 88 fL (ref 79–97)
Monocytes Absolute: 0.6 10*3/uL (ref 0.1–0.9)
Monocytes: 6 %
NEUTROS ABS: 5.2 10*3/uL (ref 1.4–7.0)
NEUTROS PCT: 60 %
RBC: 4.84 x10E6/uL (ref 3.77–5.28)
RDW: 13.7 % (ref 12.3–15.4)
WBC: 8.8 10*3/uL (ref 3.4–10.8)

## 2016-10-10 LAB — LIPID PANEL WITH LDL/HDL RATIO
CHOLESTEROL TOTAL: 223 mg/dL — AB (ref 100–199)
HDL: 39 mg/dL — AB (ref >39)
LDL CALC: 139 mg/dL — AB (ref 0–99)
LDl/HDL Ratio: 3.6 ratio units — ABNORMAL HIGH (ref 0.0–3.2)
TRIGLYCERIDES: 227 mg/dL — AB (ref 0–149)
VLDL CHOLESTEROL CAL: 45 mg/dL — AB (ref 5–40)

## 2016-10-10 LAB — VITAMIN B12: Vitamin B-12: 936 pg/mL (ref 232–1245)

## 2016-10-10 LAB — VITAMIN D 25 HYDROXY (VIT D DEFICIENCY, FRACTURES): Vit D, 25-Hydroxy: 22.6 ng/mL — ABNORMAL LOW (ref 30.0–100.0)

## 2016-10-10 LAB — T3: T3 TOTAL: 127 ng/dL (ref 71–180)

## 2016-10-10 LAB — TSH: TSH: 1.97 u[IU]/mL (ref 0.450–4.500)

## 2016-10-10 LAB — INSULIN, RANDOM: INSULIN: 14.4 u[IU]/mL (ref 2.6–24.9)

## 2016-10-22 ENCOUNTER — Ambulatory Visit (INDEPENDENT_AMBULATORY_CARE_PROVIDER_SITE_OTHER): Payer: 59 | Admitting: Family Medicine

## 2016-10-22 VITALS — BP 137/74 | HR 78 | Temp 98.7°F | Ht 63.0 in | Wt 202.0 lb

## 2016-10-22 DIAGNOSIS — E559 Vitamin D deficiency, unspecified: Secondary | ICD-10-CM

## 2016-10-22 DIAGNOSIS — Z9189 Other specified personal risk factors, not elsewhere classified: Secondary | ICD-10-CM | POA: Diagnosis not present

## 2016-10-22 DIAGNOSIS — E8881 Metabolic syndrome: Secondary | ICD-10-CM

## 2016-10-22 DIAGNOSIS — Z6835 Body mass index (BMI) 35.0-35.9, adult: Secondary | ICD-10-CM | POA: Diagnosis not present

## 2016-10-22 DIAGNOSIS — E669 Obesity, unspecified: Secondary | ICD-10-CM | POA: Diagnosis not present

## 2016-10-22 DIAGNOSIS — K5900 Constipation, unspecified: Secondary | ICD-10-CM

## 2016-10-22 MED ORDER — VITAMIN D (ERGOCALCIFEROL) 1.25 MG (50000 UNIT) PO CAPS: 50000.0000 [IU] | ORAL_CAPSULE | ORAL | 0 refills | Status: DC

## 2016-10-22 MED ORDER — METFORMIN HCL 500 MG PO TABS: 500.0000 mg | ORAL_TABLET | Freq: Every day | ORAL | 0 refills | Status: DC

## 2016-10-22 MED ORDER — BENEFIBER PO POWD: 1.0000 | Freq: Two times a day (BID) | ORAL | 0 refills | Status: DC

## 2016-10-22 MED FILL — VIT D2 1.25 MG (50,000 UNIT: 1.25 MG | 7 days supply | Qty: 4 | Fill #0

## 2016-10-22 MED FILL — metFORMIN HCL 500 MG TABS: 500 | 30 days supply | Qty: 30 | Fill #0

## 2016-10-23 ENCOUNTER — Encounter (INDEPENDENT_AMBULATORY_CARE_PROVIDER_SITE_OTHER): Payer: Self-pay | Admitting: Family Medicine

## 2016-10-23 NOTE — Progress Notes (Signed)
Office: [phone]  /  Fax: [phone]   HPI:   Chief Complaint: OBESITY Kristi Huynh is here to discuss her progress with her obesity treatment plan. She is following her eating plan approximately 75 % of the time and states she is exercising 45 minutes 3 times per week. Kristi Huynh is currently struggling with her eating plan at dinner. Kristi Huynh did well with weight loss, did well in morning and at lunch. Her weight is (P) 202 lb (91.6 kg) today and has had a weight loss of 3 pounds over a period of 2 weeks since her last visit. She has lost 3 lbs since starting treatment with [name].  Vitamin D deficiency Kristi Huynh has a diagnosis of vitamin D deficiency. She is not currently taking vit D, and denies nausea, vomiting or muscle weakness. Positive fatigue.  Insulin Resistance Kristi Huynh has a diagnosis of insulin resistance based on her elevated fasting insulin level >5. Although Kristi Huynh's blood glucose readings are still under good control, insulin resistance puts her at greater risk of metabolic syndrome and diabetes. She is not taking metformin currently and continues to work on diet and exercise to decrease risk of diabetes. Positive polyphagia especially later in the day. Positive family history of diabetes.  At risk for diabetes Kristi Huynh is at higher than averagerisk for developing diabetes due to her obesity. She currently denies polyuria or polydipsia.  Constipation Kristi Huynh reports decreased bowel movements, not hard or painful, denies rectal bleeding, but she is worried she is not having frequent enough bowel movements.    Wt Readings from Last 500 Encounters:  10/22/16 (P) 202 lb (91.6 kg)  10/09/16 205 lb (93 kg)  12/03/09 187 lb (84.8 kg)  01/09/09 176 lb (79.8 kg)  11/10/08 176 lb (79.8 kg)  10/24/08 175 lb (79.4 kg)  11/15/07 178 lb (80.7 kg)     ALLERGIES: Allergies  Allergen Reactions  . Codeine   . Oxycodone Hcl   . Penicillins   . Sulfonamide Derivatives     MEDICATIONS: Current  Outpatient Prescriptions on File Prior to Visit  Medication Sig Dispense Refill  . Ascorbic Acid (VITA-C PO) Take 2 tablets by mouth every morning.    . Loratadine-Pseudoephedrine (HCA ALLERGY/NASAL DECONGESTANT PO) Take 1 tablet by mouth every morning.    . Misc Natural Products (ENERGY FOCUS) TABS Take 1 tablet by mouth every morning.    . Multiple Vitamin (MULTIVITAMIN) tablet Take 1 tablet by mouth every morning.    . Omega-3 Fatty Acids (FISH OIL) 1000 MG CAPS Take 1 capsule by mouth every morning.    . Probiotic Product (PROBIOTIC-10 PO) Take 1 tablet by mouth every morning.    [name] 150 MG TABS Take 4 tablets by mouth every morning.    . vitamin B-12 (CYANOCOBALAMIN) 1000 MCG tablet Take 1,000 mcg by mouth daily.     No current facility-administered medications on file prior to visit.     PAST MEDICAL HISTORY: Past Medical History:  Diagnosis Date  . Anxiety   . back pain   . Depression   . joint pain   . Swelling    feet and legs    PAST SURGICAL HISTORY: Past Surgical History:  Procedure Laterality Date  . ANKLE SURGERY     Right 2 times, once as teen and once at age 39  . CYST REMOVAL HAND     Right hand age 52 and 56  . LAPAROSCOPY     endometrosis  . TONSILLECTOMY AND ADENOIDECTOMY  51 years old    SOCIAL HISTORY: Social History  Substance Use Topics  . Smoking status: Never Smoker  . Smokeless tobacco: Never Used  . Alcohol use Not on file    FAMILY HISTORY: Family History  Problem Relation Age of Onset  . Diabetes Mother   . Hypertension Mother   . Hyperlipidemia Mother   . Thyroid disease Mother   . Depression Mother   . Anxiety disorder Mother   . Eating disorder Mother   . Obesity Mother   . Heart disease Father   . Sudden death Father     ROS: Review of Systems  Constitutional: Positive for malaise/fatigue and weight loss.  Gastrointestinal: Positive for constipation. Negative for nausea and vomiting.       Negative  Bleeding  Genitourinary:       Negative Polyuria  Skin:       Positive Skin Tags  Endo/Heme/Allergies: Negative for polydipsia.       Positive Polyphagia    PHYSICAL EXAM: Blood pressure 137/74, pulse 78, temperature 98.7 F (37.1 C), temperature source Oral, height 5\' 3"  (1.6 m), weight (P) 202 lb (91.6 kg), last menstrual period 09/12/2014, SpO2 96 %. Body mass index is 35.78 kg/m (pended). Physical Exam  Constitutional: She is oriented to person, place, and time. She appears well-developed and well-nourished.  Cardiovascular: Normal rate.   Pulmonary/Chest: Effort normal.  Musculoskeletal: Normal range of motion.  Neurological: She is oriented to person, place, and time.  Skin: Skin is warm and dry.  Positive Skin Tags  Psychiatric: She has a normal mood and affect. Her behavior is normal.  Vitals reviewed.   RECENT LABS AND TESTS: BMET    Component Value Date/Time   NA 139 10/09/2016 1442   K 4.1 10/09/2016 1442   CL 98 10/09/2016 1442   CO2 23 10/09/2016 1442   GLUCOSE 88 10/09/2016 1442   GLUCOSE 93 12/03/2009 2056   BUN 8 10/09/2016 1442   CREATININE 0.81 10/09/2016 1442   CALCIUM 9.3 10/09/2016 1442   GFRNONAA 84 10/09/2016 1442   GFRAA 97 10/09/2016 1442   Lab Results  Component Value Date   HGBA1C 5.4 10/09/2016   Lab Results  Component Value Date   INSULIN 14.4 10/09/2016   CBC    Component Value Date/Time   WBC 8.8 10/09/2016 1442   WBC 6.2 10/24/2008 2117   RBC 4.84 10/09/2016 1442   RBC 4.64 10/24/2008 2117   HGB 13.9 10/24/2008 2117   HCT 42.4 10/09/2016 1442   PLT 252 10/24/2008 2117   MCV 88 10/09/2016 1442   MCH 29.8 10/09/2016 1442   MCHC 34.0 10/09/2016 1442   MCHC 32.6 10/24/2008 2117   RDW 13.7 10/09/2016 1442   LYMPHSABS 2.8 10/09/2016 1442   EOSABS 0.1 10/09/2016 1442   BASOSABS 0.1 10/09/2016 1442   Iron/TIBC/Ferritin/ %Sat No results found for: IRON, TIBC, FERRITIN, IRONPCTSAT Lipid Panel     Component Value Date/Time     CHOL 223 (H) 10/09/2016 1442   TRIG 227 (H) 10/09/2016 1442   HDL 39 (L) 10/09/2016 1442   CHOLHDL 4.7 Ratio 12/03/2009 2056   VLDL 27 12/03/2009 2056   LDLCALC 139 (H) 10/09/2016 1442   Hepatic Function Panel     Component Value Date/Time   PROT 7.9 10/09/2016 1442   ALBUMIN 4.5 10/09/2016 1442   AST 22 10/09/2016 1442   ALT 10 10/09/2016 1442   ALKPHOS 63 10/09/2016 1442   BILITOT 0.5 10/09/2016 1442  Component Value Date/Time   TSH 1.970 10/09/2016 1442   TSH 1.990 12/03/2009 2056   TSH 2.142 10/24/2008 2117    ASSESSMENT AND PLAN: Insulin resistance - Plan: metFORMIN (GLUCOPHAGE) 500 MG tablet  Vitamin D deficiency - Plan: Vitamin D, Ergocalciferol, (DRISDOL) 50000 units CAPS capsule  Constipation, unspecified constipation type - Plan: Wheat Dextrin (BENEFIBER) POWD  At risk for diabetes mellitus  Class 2 obesity without serious comorbidity with body mass index (BMI) of 35.0 to 35.9 in adult, unspecified obesity type  PLAN:  Vitamin D Deficiency Braden was informed that low vitamin D levels contributes to fatigue and are associated with obesity, breast, and colon cancer. She agrees to start to take prescription Vit D @50 ,000 IU every week #4 with no refills and will follow up for routine testing of vitamin D, at least 2-3 times per year. She was informed of the risk of over-replacement of vitamin D and agrees to not increase her dose unless he discusses this with [name] first.  Insulin Resistance Yozelin will continue to work on weight loss, exercise, and decreasing simple carbohydrates in her diet to help decrease the risk of diabetes. We dicussed metformin including benefits and risks. She was informed that eating too many simple carbohydrates or too many calories at one sitting increases the likelihood of GI side effects. Siya agrees to start to take metformin 500 MG every morning with food #30 with no refills. [place] agreed to follow up with [name] as directed to monitor  her progress.  Diabetes risk counselling Aquanetta was given extended (at least 30 minutes) diabetes prevention counseling today. She is 51 y.o. female and has risk factors for diabetes including obesity. We discussed intensive lifestyle modifications today with an emphasis on weight loss as well as increasing exercise and decreasing simple carbohydrates in her diet.  Constipation Keila agrees to start to take OTC Benefiber, 1 Tablespoon 2 times a day and will follow up with [name] in 2 weeks.  Obesity Hydie is currently in the action stage of change. As such, her goal is to continue with weight loss efforts She has agreed to follow the Category 3 plan Odetta has been instructed to work up to a goal of 150 minutes of combined cardio and strengthening exercise per week for weight loss and overall health benefits. We discussed the following Behavioral Modification Stratagies today: increasing lean protein intake, decreasing simple carbohydrates , increasing lower sugar fruits and increasing fiber rich foods  Aundreya has agreed to follow up with our clinic in 2 weeks. She was informed of the importance of frequent follow up visits to maximize her success with intensive lifestyle modifications for her multiple health conditions.  I, Doreene Nest, am acting as scribe for Dennard Nip, MD  I have reviewed the above documentation for accuracy and completeness, and I agree with the above. -Dennard Nip, MD

## 2016-11-04 ENCOUNTER — Encounter (INDEPENDENT_AMBULATORY_CARE_PROVIDER_SITE_OTHER): Payer: Self-pay | Admitting: Family Medicine

## 2016-11-04 ENCOUNTER — Ambulatory Visit (INDEPENDENT_AMBULATORY_CARE_PROVIDER_SITE_OTHER): Payer: 59 | Admitting: Family Medicine

## 2016-11-04 VITALS — BP 152/90 | HR 91 | Temp 98.7°F | Ht 63.0 in | Wt 201.0 lb

## 2016-11-04 DIAGNOSIS — I10 Essential (primary) hypertension: Secondary | ICD-10-CM | POA: Diagnosis not present

## 2016-11-04 DIAGNOSIS — E66812 Obesity, class 2: Secondary | ICD-10-CM | POA: Insufficient documentation

## 2016-11-04 DIAGNOSIS — Z9189 Other specified personal risk factors, not elsewhere classified: Secondary | ICD-10-CM | POA: Diagnosis not present

## 2016-11-04 DIAGNOSIS — K5900 Constipation, unspecified: Secondary | ICD-10-CM | POA: Diagnosis not present

## 2016-11-04 DIAGNOSIS — Z6835 Body mass index (BMI) 35.0-35.9, adult: Secondary | ICD-10-CM

## 2016-11-04 DIAGNOSIS — E669 Obesity, unspecified: Secondary | ICD-10-CM

## 2016-11-04 MED ORDER — BENEFIBER PO POWD: 1.0000 | Freq: Two times a day (BID) | ORAL | 0 refills | Status: DC

## 2016-11-04 MED ORDER — HYDROCHLOROTHIAZIDE 12.5 MG PO TABS: 12.5000 mg | ORAL_TABLET | Freq: Every day | ORAL | 0 refills | Status: DC
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
## 2016-11-05 MED FILL — HYDROCHLOROTHIAZIDE 12.5 MG: 12.5 | 30 days supply | Qty: 30 | Fill #0

## 2016-11-20 ENCOUNTER — Ambulatory Visit (INDEPENDENT_AMBULATORY_CARE_PROVIDER_SITE_OTHER): Payer: 59 | Admitting: Family Medicine

## 2016-11-20 VITALS — BP 136/77 | HR 84 | Temp 98.7°F | Ht 63.0 in | Wt 197.0 lb

## 2016-11-20 DIAGNOSIS — E669 Obesity, unspecified: Secondary | ICD-10-CM

## 2016-11-20 DIAGNOSIS — I10 Essential (primary) hypertension: Secondary | ICD-10-CM

## 2016-11-24 NOTE — Progress Notes (Signed)
Office: [phone]  /  Fax: [phone]   HPI:   Chief Complaint: OBESITY Kristi Huynh is here to discuss her progress with her obesity treatment plan. She is following her eating plan approximately 95 % of the time and states she is walking 4 miles for 60 minutes 3 times per week. Kristi Huynh is currently struggling with increased cravings in the evening. She is doing well with weight loss and is journaling some at dinner. Her weight is 197 lb (89.4 kg) today and has had a weight loss of 4 pounds over a period of 2 weeks since her last visit. She has lost 8 lbs since starting treatment with [name].  Hypertension Kristi Huynh is a 52 y.o. female with hypertension.  Kristi Huynh denies chest pain or shortness of breath on exertion. She is working weight loss to help control her blood pressure with the goal of decreasing her risk of heart attack and stroke. Kristi Huynh blood pressure is currently controlled. Her blood pressure is improved with HCTZ. She has increased diuresis and denies headache or dizziness.  Wt Readings from Last 500 Encounters:  11/20/16 197 lb (89.4 kg)  11/04/16 201 lb (91.2 kg)  10/22/16 202 lb (91.6 kg)  10/09/16 205 lb (93 kg)  12/03/09 187 lb (84.8 kg)  01/09/09 176 lb (79.8 kg)  11/10/08 176 lb (79.8 kg)  10/24/08 175 lb (79.4 kg)  11/15/07 178 lb (80.7 kg)     ALLERGIES: Allergies  Allergen Reactions  . Codeine   . Oxycodone Hcl   . Penicillins   . Sulfonamide Derivatives     MEDICATIONS: Current Outpatient Prescriptions on File Prior to Visit  Medication Sig Dispense Refill  . Ascorbic Acid (VITA-C PO) Take 2 tablets by mouth every morning.    . hydrochlorothiazide (HYDRODIURIL) 12.5 MG tablet Take 1 tablet (12.5 mg total) by mouth daily. 30 tablet 0  . Loratadine-Pseudoephedrine (HCA ALLERGY/NASAL DECONGESTANT PO) Take 1 tablet by mouth every morning.    . metFORMIN (GLUCOPHAGE) 500 MG tablet Take 1 tablet (500 mg total) by mouth daily with breakfast. 30  tablet 0  . Misc Natural Products (ENERGY FOCUS) TABS Take 1 tablet by mouth every morning.    . Multiple Vitamin (MULTIVITAMIN) tablet Take 1 tablet by mouth every morning.    . Omega-3 Fatty Acids (FISH OIL) 1000 MG CAPS Take 1 capsule by mouth every morning.    . Probiotic Product (PROBIOTIC-10 PO) Take 1 tablet by mouth every morning.    Kristi Huynh [name] 150 MG TABS Take 4 tablets by mouth every morning.    . vitamin B-12 (CYANOCOBALAMIN) 1000 MCG tablet Take 1,000 mcg by mouth daily.    . Vitamin D, Ergocalciferol, (DRISDOL) 50000 units CAPS capsule Take 1 capsule (50,000 Units total) by mouth every 7 (seven) days. 4 capsule 0  . Wheat Dextrin (BENEFIBER) POWD Take 1 scoop by mouth 2 (two) times daily. One TBSP BID  0   No current facility-administered medications on file prior to visit.     PAST MEDICAL HISTORY: Past Medical History:  Diagnosis Date  . Anxiety   . back pain   . Depression   . joint pain   . Swelling    feet and legs    PAST SURGICAL HISTORY: Past Surgical History:  Procedure Laterality Date  . ANKLE SURGERY     Right 2 times, once as teen and once at age 43  . CYST REMOVAL HAND     Right hand age  11 and 13  . LAPAROSCOPY     endometrosis  . TONSILLECTOMY AND ADENOIDECTOMY     51 years old    SOCIAL HISTORY: Social History  Substance Use Topics  . Smoking status: Never Smoker  . Smokeless tobacco: Never Used  . Alcohol use Not on file    FAMILY HISTORY: Family History  Problem Relation Age of Onset  . Diabetes Mother   . Hypertension Mother   . Hyperlipidemia Mother   . Thyroid disease Mother   . Depression Mother   . Anxiety disorder Mother   . Eating disorder Mother   . Obesity Mother   . Heart disease Father   . Sudden death Father     ROS: Review of Systems  Constitutional: Positive for weight loss.  Genitourinary: Positive for frequency.  Neurological: Negative for dizziness and headaches.    PHYSICAL EXAM: Blood pressure  136/77, pulse 84, temperature 98.7 F (37.1 C), temperature source Oral, height 5\' 3"  (1.6 m), weight 197 lb (89.4 kg), SpO2 97 %. Body mass index is 34.9 kg/m. Physical Exam  Constitutional: She is oriented to person, place, and time. She appears well-developed and well-nourished.  Cardiovascular: Normal rate.   Pulmonary/Chest: Effort normal.  Musculoskeletal: Normal range of motion.  Neurological: She is oriented to person, place, and time.  Skin: Skin is warm and dry.  Psychiatric: She has a normal mood and affect.  Vitals reviewed.   RECENT LABS AND TESTS: BMET    Component Value Date/Time   NA 139 10/09/2016 1442   K 4.1 10/09/2016 1442   CL 98 10/09/2016 1442   CO2 23 10/09/2016 1442   GLUCOSE 88 10/09/2016 1442   GLUCOSE 93 12/03/2009 2056   BUN 8 10/09/2016 1442   CREATININE 0.81 10/09/2016 1442   CALCIUM 9.3 10/09/2016 1442   GFRNONAA 84 10/09/2016 1442   GFRAA 97 10/09/2016 1442   Lab Results  Component Value Date   HGBA1C 5.4 10/09/2016   Lab Results  Component Value Date   INSULIN 14.4 10/09/2016   CBC    Component Value Date/Time   WBC 8.8 10/09/2016 1442   WBC 6.2 10/24/2008 2117   RBC 4.84 10/09/2016 1442   RBC 4.64 10/24/2008 2117   HGB 13.9 10/24/2008 2117   HCT 42.4 10/09/2016 1442   PLT 252 10/24/2008 2117   MCV 88 10/09/2016 1442   MCH 29.8 10/09/2016 1442   MCHC 34.0 10/09/2016 1442   MCHC 32.6 10/24/2008 2117   RDW 13.7 10/09/2016 1442   LYMPHSABS 2.8 10/09/2016 1442   EOSABS 0.1 10/09/2016 1442   BASOSABS 0.1 10/09/2016 1442   Iron/TIBC/Ferritin/ %Sat No results found for: IRON, TIBC, FERRITIN, IRONPCTSAT Lipid Panel     Component Value Date/Time   CHOL 223 (H) 10/09/2016 1442   TRIG 227 (H) 10/09/2016 1442   HDL 39 (L) 10/09/2016 1442   CHOLHDL 4.7 Ratio 12/03/2009 2056   VLDL 27 12/03/2009 2056   LDLCALC 139 (H) 10/09/2016 1442   Hepatic Function Panel     Component Value Date/Time   PROT 7.9 10/09/2016 1442   ALBUMIN  4.5 10/09/2016 1442   AST 22 10/09/2016 1442   ALT 10 10/09/2016 1442   ALKPHOS 63 10/09/2016 1442   BILITOT 0.5 10/09/2016 1442      Component Value Date/Time   TSH 1.970 10/09/2016 1442   TSH 1.990 12/03/2009 2056   TSH 2.142 10/24/2008 2117    ASSESSMENT AND PLAN: Hypertension, unspecified type  Obesity (BMI 30.0-34.9)  PLAN:  Hypertension We discussed sodium restriction, working on healthy weight loss, and a regular exercise program as the means to achieve improved blood pressure control. Kristi Huynh agreed with this plan and agreed to follow up as directed. We will continue to monitor her blood pressure as well as her progress with the above lifestyle modifications. She will continue her medications as prescribed and will watch for signs of hypotension as she continues her lifestyle modifications. We will re-check her blood pressure in 2 weeks.  We spent > than 50% of the 15 minute visit on the counseling as documented in the note.  Obesity Kristi Huynh is currently in the action stage of change. As such, her goal is to continue with weight loss efforts She has agreed to keep a food journal with 400-500 calories and 35+ grams of protein daily and follow the Category 2 plan Kristi Huynh has been instructed to work up to a goal of 150 minutes of combined cardio and strengthening exercise per week for weight loss and overall health benefits. We discussed the following Behavioral Modification Stratagies today: increasing lean protein intake, increasing lower sugar fruits and ways to avoid night time snacking  Kristi Huynh has agreed to follow up with our clinic in 2 weeks. She was informed of the importance of frequent follow up visits to maximize her success with intensive lifestyle modifications for her multiple health conditions.  I, Doreene Nest, am acting as scribe for Dennard Nip, MD  I have reviewed the above documentation for accuracy and completeness, and I agree with the above. -Dennard Nip,  MD

## 2016-12-03 ENCOUNTER — Ambulatory Visit (INDEPENDENT_AMBULATORY_CARE_PROVIDER_SITE_OTHER): Payer: 59 | Admitting: Family Medicine

## 2016-12-03 VITALS — BP 127/71 | HR 86 | Temp 98.2°F | Ht 63.0 in | Wt 196.0 lb

## 2016-12-03 DIAGNOSIS — Z6834 Body mass index (BMI) 34.0-34.9, adult: Secondary | ICD-10-CM | POA: Diagnosis not present

## 2016-12-03 DIAGNOSIS — E669 Obesity, unspecified: Secondary | ICD-10-CM | POA: Diagnosis not present

## 2016-12-03 DIAGNOSIS — E8881 Metabolic syndrome: Secondary | ICD-10-CM

## 2016-12-03 DIAGNOSIS — E88819 Insulin resistance, unspecified: Secondary | ICD-10-CM

## 2016-12-03 DIAGNOSIS — E559 Vitamin D deficiency, unspecified: Secondary | ICD-10-CM

## 2016-12-03 DIAGNOSIS — R7303 Prediabetes: Secondary | ICD-10-CM | POA: Insufficient documentation

## 2016-12-03 MED ORDER — VITAMIN D (ERGOCALCIFEROL) 1.25 MG (50000 UNIT) PO CAPS: 50000.0000 [IU] | ORAL_CAPSULE | ORAL | 0 refills | Status: DC

## 2016-12-03 MED ORDER — METFORMIN HCL 500 MG PO TABS: 500.0000 mg | ORAL_TABLET | Freq: Every day | ORAL | 0 refills | Status: DC

## 2016-12-04 MED FILL — metFORMIN HCL 500 MG TABS: 500 | 30 days supply | Qty: 30 | Fill #0

## 2016-12-04 MED FILL — VIT D2 1.25 MG (50,000 UNIT: 1.25 MG | 28 days supply | Qty: 4 | Fill #0

## 2016-12-04 NOTE — Progress Notes (Signed)
Office: [phone]  /  Fax: [phone]   HPI:   Chief Complaint: OBESITY Kristi Huynh is here to discuss her progress with her obesity treatment plan. She is following her eating plan approximately 90 % of the time and states she is exercising 4 miles  3-4 times per week. Patient continues to lose weight regularly, she is writing down her food and has had an increase in stress at home and work. She has been distracted but has still made better choices.  Her weight is 196 lb (88.9 kg) today and has had a weight loss of 1 pounds over a period of 2 weeks since her last visit. She has lost 9 lbs since starting treatment with [name].    Vitamin D deficiency Kristi Huynh has a diagnosis of vitamin D deficiency. She is currently taking vit D and denies nausea, vomiting or muscle weakness. She is not yet at goal.   Insulin Resistance Kristi Huynh has a diagnosis of insulin resistance based on her elevated fasting insulin level >5. Although Kristi Huynh's blood glucose readings are still under good control, insulin resistance puts her at greater risk of metabolic syndrome and diabetes. She is taking metformin currently and continues to work on diet and exercise to decrease risk of diabetes.  Wt Readings from Last 500 Encounters:  12/03/16 196 lb (88.9 kg)  11/20/16 197 lb (89.4 kg)  11/04/16 201 lb (91.2 kg)  10/22/16 202 lb (91.6 kg)  10/09/16 205 lb (93 kg)  12/03/09 187 lb (84.8 kg)  01/09/09 176 lb (79.8 kg)  11/10/08 176 lb (79.8 kg)  10/24/08 175 lb (79.4 kg)  11/15/07 178 lb (80.7 kg)     ALLERGIES: Allergies  Allergen Reactions  . Codeine   . Oxycodone Hcl   . Penicillins   . Sulfonamide Derivatives     MEDICATIONS: Current Outpatient Prescriptions on File Prior to Visit  Medication Sig Dispense Refill  . Ascorbic Acid (VITA-C PO) Take 2 tablets by mouth every morning.    . hydrochlorothiazide (HYDRODIURIL) 12.5 MG tablet Take 1 tablet (12.5 mg total) by mouth daily. 30 tablet 0  .  Loratadine-Pseudoephedrine (HCA ALLERGY/NASAL DECONGESTANT PO) Take 1 tablet by mouth every morning.    . Misc Natural Products (ENERGY FOCUS) TABS Take 1 tablet by mouth every morning.    . Multiple Vitamin (MULTIVITAMIN) tablet Take 1 tablet by mouth every morning.    . Omega-3 Fatty Acids (FISH OIL) 1000 MG CAPS Take 1 capsule by mouth every morning.    . Probiotic Product (PROBIOTIC-10 PO) Take 1 tablet by mouth every morning.    Kristi Huynh 150 MG TABS Take 4 tablets by mouth every morning.    . vitamin B-12 (CYANOCOBALAMIN) 1000 MCG tablet Take 1,000 mcg by mouth daily.    . Wheat Dextrin (BENEFIBER) POWD Take 1 scoop by mouth 2 (two) times daily. One TBSP BID  0   No current facility-administered medications on file prior to visit.     PAST MEDICAL HISTORY: Past Medical History:  Diagnosis Date  . Anxiety   . back pain   . Depression   . joint pain   . Swelling    feet and legs    PAST SURGICAL HISTORY: Past Surgical History:  Procedure Laterality Date  . ANKLE SURGERY     Right 2 times, once as teen and once at age 19  . CYST REMOVAL HAND     Right hand age 55 and 63  . LAPAROSCOPY  endometrosis  . TONSILLECTOMY AND ADENOIDECTOMY     51 years old    SOCIAL HISTORY: Social History  Substance Use Topics  . Smoking status: Never Smoker  . Smokeless tobacco: Never Used  . Alcohol use Not on file    FAMILY HISTORY: Family History  Problem Relation Age of Onset  . Diabetes Mother   . Hypertension Mother   . Hyperlipidemia Mother   . Thyroid disease Mother   . Depression Mother   . Anxiety disorder Mother   . Eating disorder Mother   . Obesity Mother   . Heart disease Father   . Sudden death Father     ROS: Review of Systems  Constitutional: Positive for weight loss.  All other systems reviewed and are negative.   PHYSICAL EXAM: Blood pressure 127/71, pulse 86, temperature 98.2 F (36.8 C), temperature source Oral, height 5\' 3"  (1.6 m),  weight 196 lb (88.9 kg), SpO2 93 %. Body mass index is 34.72 kg/m. Physical Exam  Constitutional: She is oriented to person, place, and time. She appears well-developed and well-nourished.  Eyes: Pupils are equal, round, and reactive to light.  Neck: Normal range of motion.  Cardiovascular: Normal rate and regular rhythm.   Pulmonary/Chest: Effort normal.  Abdominal: Soft.  Musculoskeletal: Normal range of motion.  Neurological: She is alert and oriented to person, place, and time.  Skin: Skin is warm and dry.  Psychiatric: She has a normal mood and affect. Her behavior is normal.    RECENT LABS AND TESTS: BMET    Component Value Date/Time   NA 139 10/09/2016 1442   K 4.1 10/09/2016 1442   CL 98 10/09/2016 1442   CO2 23 10/09/2016 1442   GLUCOSE 88 10/09/2016 1442   GLUCOSE 93 12/03/2009 2056   BUN 8 10/09/2016 1442   CREATININE 0.81 10/09/2016 1442   CALCIUM 9.3 10/09/2016 1442   GFRNONAA 84 10/09/2016 1442   GFRAA 97 10/09/2016 1442   Lab Results  Component Value Date   HGBA1C 5.4 10/09/2016   Lab Results  Component Value Date   INSULIN 14.4 10/09/2016   CBC    Component Value Date/Time   WBC 8.8 10/09/2016 1442   WBC 6.2 10/24/2008 2117   RBC 4.84 10/09/2016 1442   RBC 4.64 10/24/2008 2117   HGB 13.9 10/24/2008 2117   HCT 42.4 10/09/2016 1442   PLT 252 10/24/2008 2117   MCV 88 10/09/2016 1442   MCH 29.8 10/09/2016 1442   MCHC 34.0 10/09/2016 1442   MCHC 32.6 10/24/2008 2117   RDW 13.7 10/09/2016 1442   LYMPHSABS 2.8 10/09/2016 1442   EOSABS 0.1 10/09/2016 1442   BASOSABS 0.1 10/09/2016 1442   Iron/TIBC/Ferritin/ %Sat No results found for: IRON, TIBC, FERRITIN, IRONPCTSAT Lipid Panel     Component Value Date/Time   CHOL 223 (H) 10/09/2016 1442   TRIG 227 (H) 10/09/2016 1442   HDL 39 (L) 10/09/2016 1442   CHOLHDL 4.7 Ratio 12/03/2009 2056   VLDL 27 12/03/2009 2056   LDLCALC 139 (H) 10/09/2016 1442   Hepatic Function Panel     Component Value  Date/Time   PROT 7.9 10/09/2016 1442   ALBUMIN 4.5 10/09/2016 1442   AST 22 10/09/2016 1442   ALT 10 10/09/2016 1442   ALKPHOS 63 10/09/2016 1442   BILITOT 0.5 10/09/2016 1442      Component Value Date/Time   TSH 1.970 10/09/2016 1442   TSH 1.990 12/03/2009 2056   TSH 2.142 10/24/2008 2117  ASSESSMENT AND PLAN: Insulin resistance - Plan: metFORMIN (GLUCOPHAGE) 500 MG tablet  Vitamin D deficiency - Plan: Vitamin D, Ergocalciferol, (DRISDOL) 50000 units CAPS capsule  Class 1 obesity without serious comorbidity with body mass index (BMI) of 34.0 to 34.9 in adult, unspecified obesity type  PLAN:  Vitamin D Deficiency Cecelia was informed that low vitamin D levels contributes to fatigue and are associated with obesity, breast, and colon cancer. She agrees to continue to take prescription Vit D @50 ,000 IU every week and will follow up for routine testing of vitamin D, at least 2-3 times per year. She was informed of the risk of over-replacement of vitamin D and agrees to not increase her dose unless he discusses this with [name] first. Her prescription was written today for a 30 day supply and no refills.   Insulin Resistance Ase will continue to work on weight loss, exercise, and decreasing simple carbohydrates in her diet to help decrease the risk of diabetes. We dicussed metformin including benefits and risks. She was informed that eating too many simple carbohydrates or too many calories at one sitting increases the likelihood of GI side effects. Lakyra requested metformin for now and prescription was written today. Lilygrace agreed to follow up with [name] as directed to monitor her progress.  Obesity Yevonne is currently in the action stage of change. As such, her goal is to continue with weight loss efforts She has agreed to follow the Category 2 plan Vashanti has been instructed to work up to a goal of 150 minutes of combined cardio and strengthening exercise per week for weight loss and overall  health benefits. We discussed the following Behavioral Modification Stratagies today: increasing lean protein intake, decreasing simple carbohydrates , increasing vegetables, increasing lower sugar fruits and emotional eating strategies  Ranette has agreed to follow up with our clinic in 2 weeks. She was informed of the importance of frequent follow up visits to maximize her success with intensive lifestyle modifications for her multiple health conditions.  I, April Moore , am acting as Education administrator for Dennard Nip, MD  I have reviewed the above documentation for accuracy and completeness, and I agree with the above. -Dennard Nip, MD

## 2016-12-23 ENCOUNTER — Ambulatory Visit (INDEPENDENT_AMBULATORY_CARE_PROVIDER_SITE_OTHER): Payer: 59 | Admitting: Family Medicine

## 2016-12-31 ENCOUNTER — Ambulatory Visit (INDEPENDENT_AMBULATORY_CARE_PROVIDER_SITE_OTHER): Payer: 59 | Admitting: Family Medicine

## 2016-12-31 VITALS — BP 117/69 | HR 77 | Temp 98.4°F | Resp 16 | Ht 63.0 in | Wt 193.0 lb

## 2016-12-31 DIAGNOSIS — K5900 Constipation, unspecified: Secondary | ICD-10-CM

## 2016-12-31 DIAGNOSIS — Z6834 Body mass index (BMI) 34.0-34.9, adult: Secondary | ICD-10-CM

## 2016-12-31 DIAGNOSIS — E66811 Obesity, class 1: Secondary | ICD-10-CM

## 2016-12-31 DIAGNOSIS — R7303 Prediabetes: Secondary | ICD-10-CM | POA: Diagnosis not present

## 2016-12-31 DIAGNOSIS — I10 Essential (primary) hypertension: Secondary | ICD-10-CM

## 2016-12-31 DIAGNOSIS — E559 Vitamin D deficiency, unspecified: Secondary | ICD-10-CM | POA: Diagnosis not present

## 2016-12-31 DIAGNOSIS — E669 Obesity, unspecified: Secondary | ICD-10-CM | POA: Diagnosis not present

## 2016-12-31 MED ORDER — HYDROCHLOROTHIAZIDE 12.5 MG PO TABS: 12.5000 mg | ORAL_TABLET | Freq: Every day | ORAL | 0 refills | Status: DC

## 2016-12-31 MED ORDER — METFORMIN HCL 500 MG PO TABS: 500.0000 mg | ORAL_TABLET | Freq: Every day | ORAL | 0 refills | Status: DC

## 2016-12-31 MED ORDER — VITAMIN D (ERGOCALCIFEROL) 1.25 MG (50000 UNIT) PO CAPS: 50000.0000 [IU] | ORAL_CAPSULE | ORAL | 0 refills | Status: DC

## 2016-12-31 MED ORDER — POLYETHYLENE GLYCOL 3350 17 GM/SCOOP PO POWD: 17.0000 g | Freq: Every morning | ORAL | 0 refills | Status: DC

## 2016-12-31 MED FILL — POLYETHYLENE GLYCOL 3350 PO: 30 days supply | Qty: 527 | Fill #0

## 2016-12-31 MED FILL — HYDROCHLOROTHIAZIDE 12.5 MG: 12.5 | 30 days supply | Qty: 30 | Fill #0

## 2016-12-31 MED FILL — VIT D2 1.25 MG (50,000 UNIT: 1.25 MG | 28 days supply | Qty: 4 | Fill #0

## 2016-12-31 MED FILL — metFORMIN HCL 500 MG TABS: 500 | 30 days supply | Qty: 30 | Fill #0

## 2016-12-31 NOTE — Progress Notes (Signed)
Office: [phone]  /  Fax: [phone]   HPI:   Chief Complaint: OBESITY Kristi Huynh is here to discuss her progress with her obesity treatment plan. She is following her eating plan approximately 80 % of the time and states she is exercising 6 miles per week. Kristi Huynh continues to do well with weight loss even while on vacation. She notes some family sabotage. Her weight is 193 lb (87.5 kg) today and has had a weight loss of 3 pounds over a period of 4 weeks since her last visit. She has lost 12 lbs since starting treatment with [name].  Vitamin D deficiency Kristi Huynh has a diagnosis of vitamin D deficiency. She is currently taking vit D, not yet at goal and denies nausea, vomiting or muscle weakness.  Hypertension [name] is a 51 y.o. female with hypertension.  [name] denies chest pain or shortness of breath on exertion. She is working weight loss to help control her blood pressure with the goal of decreasing her risk of heart attack and stroke. [name]'s blood pressure is stable on HCTZ, she denies lightheadedness or chest pain. Blood pressure is not currently controlled.  Constipation [name] notes constipation for the last few weeks, worse since attempting weight loss. She states BM are less frequent and are hard and painful. She notes increased bloating and decreased bowel movements especially worse in the last 2 weeks, no improvement with daily Benefiber. She denies hematochezia or melena. She admits to drinking less H20 recently.  Pre-Diabetes Kristi Huynh has a diagnosis of prediabetes based on her elevated Hgb A1c and was informed this puts her at greater risk of developing diabetes. She is stable on metformin currently and continues to work on diet and exercise to decrease risk of diabetes. She denies nausea, vomiting or hypoglycemia.    Wt Readings from Last 500 Encounters:  12/31/16 193 lb (87.5 kg)  12/03/16 196 lb (88.9 kg)  11/20/16 197 lb (89.4 kg)  11/04/16 201 lb (91.2  kg)  10/22/16 202 lb (91.6 kg)  10/09/16 205 lb (93 kg)  12/03/09 187 lb (84.8 kg)  01/09/09 176 lb (79.8 kg)  11/10/08 176 lb (79.8 kg)  10/24/08 175 lb (79.4 kg)  11/15/07 178 lb (80.7 kg)     ALLERGIES: Allergies  Allergen Reactions  . Codeine   . Oxycodone Hcl   . Penicillins   . Sulfonamide Derivatives     MEDICATIONS: Current Outpatient Prescriptions on File Prior to Visit  Medication Sig Dispense Refill  . Ascorbic Acid (VITA-C PO) Take 2 tablets by mouth every morning.    . Loratadine-Pseudoephedrine (HCA ALLERGY/NASAL DECONGESTANT PO) Take 1 tablet by mouth every morning.    . Misc Natural Products (ENERGY FOCUS) TABS Take 1 tablet by mouth every morning.    . Multiple Vitamin (MULTIVITAMIN) tablet Take 1 tablet by mouth every morning.    . Omega-3 Fatty Acids (FISH OIL) 1000 MG CAPS Take 1 capsule by mouth every morning.    . Probiotic Product (PROBIOTIC-10 PO) Take 1 tablet by mouth every morning.    [name] 150 MG TABS Take 4 tablets by mouth every morning.    . vitamin B-12 (CYANOCOBALAMIN) 1000 MCG tablet Take 1,000 mcg by mouth daily.    . Wheat Dextrin (BENEFIBER) POWD Take 1 scoop by mouth 2 (two) times daily. One TBSP BID  0   No current facility-administered medications on file prior to visit.     PAST MEDICAL HISTORY: Past Medical History:  Diagnosis Date  . Anxiety   . back pain   . Depression   . joint pain   . Swelling    feet and legs    PAST SURGICAL HISTORY: Past Surgical History:  Procedure Laterality Date  . ANKLE SURGERY     Right 2 times, once as teen and once at age 81  . CYST REMOVAL HAND     Right hand age 42 and 60  . LAPAROSCOPY     endometrosis  . TONSILLECTOMY AND ADENOIDECTOMY     51 years old    SOCIAL HISTORY: Social History  Substance Use Topics  . Smoking status: Never Smoker  . Smokeless tobacco: Never Used  . Alcohol use Not on file    FAMILY HISTORY: Family History  Problem Relation Age of Onset   . Diabetes Mother   . Hypertension Mother   . Hyperlipidemia Mother   . Thyroid disease Mother   . Depression Mother   . Anxiety disorder Mother   . Eating disorder Mother   . Obesity Mother   . Heart disease Father   . Sudden death Father     ROS: Review of Systems  Constitutional: Positive for weight loss.  Cardiovascular: Negative for chest pain.       Negative lightheadedness  Gastrointestinal: Positive for constipation. Negative for melena, nausea and vomiting.  Musculoskeletal:       Negative muscle weakness  Endo/Heme/Allergies:       Negative hypoglycemia    PHYSICAL EXAM: Blood pressure 117/69, pulse 77, temperature 98.4 F (36.9 C), temperature source Oral, resp. rate 16, height 5\' 3"  (1.6 m), weight 193 lb (87.5 kg), SpO2 97 %. Body mass index is 34.19 kg/m. Physical Exam  Constitutional: She is oriented to person, place, and time. She appears well-developed and well-nourished.  Cardiovascular: Normal rate.   Pulmonary/Chest: Effort normal.  Musculoskeletal: Normal range of motion.  Neurological: She is oriented to person, place, and time.  Skin: Skin is warm and dry.  Psychiatric: She has a normal mood and affect. Her behavior is normal.  Vitals reviewed.   RECENT LABS AND TESTS: BMET    Component Value Date/Time   NA 139 10/09/2016 1442   K 4.1 10/09/2016 1442   CL 98 10/09/2016 1442   CO2 23 10/09/2016 1442   GLUCOSE 88 10/09/2016 1442   GLUCOSE 93 12/03/2009 2056   BUN 8 10/09/2016 1442   CREATININE 0.81 10/09/2016 1442   CALCIUM 9.3 10/09/2016 1442   GFRNONAA 84 10/09/2016 1442   GFRAA 97 10/09/2016 1442   Lab Results  Component Value Date   HGBA1C 5.4 10/09/2016   Lab Results  Component Value Date   INSULIN 14.4 10/09/2016   CBC    Component Value Date/Time   WBC 8.8 10/09/2016 1442   WBC 6.2 10/24/2008 2117   RBC 4.84 10/09/2016 1442   RBC 4.64 10/24/2008 2117   HGB 13.9 10/24/2008 2117   HCT 42.4 10/09/2016 1442   PLT 252  10/24/2008 2117   MCV 88 10/09/2016 1442   MCH 29.8 10/09/2016 1442   MCHC 34.0 10/09/2016 1442   MCHC 32.6 10/24/2008 2117   RDW 13.7 10/09/2016 1442   LYMPHSABS 2.8 10/09/2016 1442   EOSABS 0.1 10/09/2016 1442   BASOSABS 0.1 10/09/2016 1442   Iron/TIBC/Ferritin/ %Sat No results found for: IRON, TIBC, FERRITIN, IRONPCTSAT Lipid Panel     Component Value Date/Time   CHOL 223 (H) 10/09/2016 1442   TRIG 227 (H) 10/09/2016 1442  HDL 39 (L) 10/09/2016 1442   CHOLHDL 4.7 Ratio 12/03/2009 2056   VLDL 27 12/03/2009 2056   LDLCALC 139 (H) 10/09/2016 1442   Hepatic Function Panel     Component Value Date/Time   PROT 7.9 10/09/2016 1442   ALBUMIN 4.5 10/09/2016 1442   AST 22 10/09/2016 1442   ALT 10 10/09/2016 1442   ALKPHOS 63 10/09/2016 1442   BILITOT 0.5 10/09/2016 1442      Component Value Date/Time   TSH 1.970 10/09/2016 1442   TSH 1.990 12/03/2009 2056   TSH 2.142 10/24/2008 2117    ASSESSMENT AND PLAN: Essential hypertension - Plan: hydrochlorothiazide (HYDRODIURIL) 12.5 MG tablet  Vitamin D deficiency - Plan: Vitamin D, Ergocalciferol, (DRISDOL) 50000 units CAPS capsule  Prediabetes - Plan: metFORMIN (GLUCOPHAGE) 500 MG tablet  Constipation, unspecified constipation type - Plan: polyethylene glycol powder (GLYCOLAX/MIRALAX) powder  Class 1 obesity without serious comorbidity with body mass index (BMI) of 34.0 to 34.9 in adult, unspecified obesity type  PLAN:  Vitamin D Deficiency Yaslene was informed that low vitamin D levels contributes to fatigue and are associated with obesity, breast, and colon cancer. She agrees to continue to take prescription Vit D @50 ,000 IU every week #4 with no refills and will follow up for routine testing of vitamin D, at least 2-3 times per year. She was informed of the risk of over-replacement of vitamin D and agrees to not increase her dose unless he discusses this with [name] first.  Hypertension We discussed sodium restriction,  working on healthy weight loss, and a regular exercise program as the means to achieve improved blood pressure control. Alfredo agreed with this plan and agreed to follow up as directed. We will continue to monitor her blood pressure as well as her progress with the above lifestyle modifications. She will continue her medications as prescribed and will watch for signs of hypotension as she continues her lifestyle modifications. We will refill HCTZ for 1 month and Anise agrees to follow up with our clinic in 2 weeks.  Constipation Flavia was informed decrease bowel movement frequency is normal while losing weight, but stools should not be hard or painful. She was advised to increase her H20 intake and work on increasing her fiber intake. High fiber foods were discussed today. She agrees to start prescription Miralax 17 grams qd #30 with no refills and will follow up with our clinic in 2 weeks.  Pre-Diabetes Uriyah will continue to work on weight loss, exercise, and decreasing simple carbohydrates in her diet to help decrease the risk of diabetes. We dicussed metformin including benefits and risks. She was informed that eating too many simple carbohydrates or too many calories at one sitting increases the likelihood of GI side effects. Uri agrees to continue metformin for now and a prescription was written today for 1 month refill. Lillianna agreed to follow up with [name] as directed to monitor her progress.  Obesity Beverlyann is currently in the action stage of change. As such, her goal is to continue with weight loss efforts She has agreed to follow the Category 2 plan Nykira has been instructed to work up to a goal of 150 minutes of combined cardio and strengthening exercise per week or continue 6 miles per week for weight loss and overall health benefits. We discussed the following Behavioral Modification Stratagies today: increasing lean protein intake, decreasing simple carbohydrates , increasing vegetables and  dealing with family or coworker sabotage  Sherie has agreed to follow up with our  clinic in 2 weeks. She was informed of the importance of frequent follow up visits to maximize her success with intensive lifestyle modifications for her multiple health conditions.  I, Doreene Nest, am acting as scribe for Dennard Nip, MD  I have reviewed the above documentation for accuracy and completeness, and I agree with the above. -Dennard Nip, MD

## 2017-01-14 ENCOUNTER — Ambulatory Visit (INDEPENDENT_AMBULATORY_CARE_PROVIDER_SITE_OTHER): Payer: 59 | Admitting: Family Medicine

## 2017-01-14 VITALS — BP 135/80 | HR 90 | Temp 97.9°F | Wt 189.0 lb

## 2017-01-14 DIAGNOSIS — E669 Obesity, unspecified: Secondary | ICD-10-CM | POA: Diagnosis not present

## 2017-01-14 DIAGNOSIS — Z6833 Body mass index (BMI) 33.0-33.9, adult: Secondary | ICD-10-CM

## 2017-01-14 DIAGNOSIS — K5909 Other constipation: Secondary | ICD-10-CM

## 2017-01-14 NOTE — Progress Notes (Signed)
Office: [phone]  /  Fax: [phone]   HPI:   Chief Complaint: OBESITY Kristi Huynh is here to discuss her progress with her obesity treatment plan. She is following her eating plan approximately 95 % of the time and states she is walking 2 miles 2 times per week. Kristi Huynh continues to do well with weight loss. She did well with portion control on Easter. She has increased walking and feels well overall. Her weight is 189 lb (85.7 kg) today and has had a weight loss of 4 pounds over a period of 2 weeks since her last visit. She has lost 16 lbs since starting treatment with [name].  Constipation [name] notes constipation for the last few weeks, worse since attempting weight loss. She states BM are less frequent and are not hard and painful. She denies hematochezia or melena. She is improved on diet with increased H20 and Miralax, using 2 times per week on average and feeling much better. She denies diarrhea.  Wt Readings from Last 500 Encounters:  01/14/17 189 lb (85.7 kg)  12/31/16 193 lb (87.5 kg)  12/03/16 196 lb (88.9 kg)  11/20/16 197 lb (89.4 kg)  11/04/16 201 lb (91.2 kg)  10/22/16 202 lb (91.6 kg)  10/09/16 205 lb (93 kg)  12/03/09 187 lb (84.8 kg)  01/09/09 176 lb (79.8 kg)  11/10/08 176 lb (79.8 kg)  10/24/08 175 lb (79.4 kg)  11/15/07 178 lb (80.7 kg)     ALLERGIES: Allergies  Allergen Reactions  . Codeine   . Oxycodone Hcl   . Penicillins   . Sulfonamide Derivatives     MEDICATIONS: Current Outpatient Prescriptions on File Prior to Visit  Medication Sig Dispense Refill  . Ascorbic Acid (VITA-C PO) Take 2 tablets by mouth every morning.    . hydrochlorothiazide (HYDRODIURIL) 12.5 MG tablet Take 1 tablet (12.5 mg total) by mouth daily. 30 tablet 0  . Loratadine-Pseudoephedrine (HCA ALLERGY/NASAL DECONGESTANT PO) Take 1 tablet by mouth every morning.    . metFORMIN (GLUCOPHAGE) 500 MG tablet Take 1 tablet (500 mg total) by mouth daily with breakfast. 30 tablet 0  .  Misc Natural Products (ENERGY FOCUS) TABS Take 1 tablet by mouth every morning.    . Multiple Vitamin (MULTIVITAMIN) tablet Take 1 tablet by mouth every morning.    . Omega-3 Fatty Acids (FISH OIL) 1000 MG CAPS Take 1 capsule by mouth every morning.    . polyethylene glycol powder (GLYCOLAX/MIRALAX) powder Take 17 g by mouth every morning. 3350 g 0  . Probiotic Product (PROBIOTIC-10 PO) Take 1 tablet by mouth every morning.    [name] 150 MG TABS Take 4 tablets by mouth every morning.    . vitamin B-12 (CYANOCOBALAMIN) 1000 MCG tablet Take 1,000 mcg by mouth daily.    . Vitamin D, Ergocalciferol, (DRISDOL) 50000 units CAPS capsule Take 1 capsule (50,000 Units total) by mouth every 7 (seven) days. 4 capsule 0  . Wheat Dextrin (BENEFIBER) POWD Take 1 scoop by mouth 2 (two) times daily. One TBSP BID  0   No current facility-administered medications on file prior to visit.     PAST MEDICAL HISTORY: Past Medical History:  Diagnosis Date  . Anxiety   . back pain   . Depression   . joint pain   . Swelling    feet and legs    PAST SURGICAL HISTORY: Past Surgical History:  Procedure Laterality Date  . ANKLE SURGERY     Right 2 times, once  as teen and once at age 67  . CYST REMOVAL HAND     Right hand age 1 and 51  . LAPAROSCOPY     endometrosis  . TONSILLECTOMY AND ADENOIDECTOMY     51 years old    SOCIAL HISTORY: Social History  Substance Use Topics  . Smoking status: Never Smoker  . Smokeless tobacco: Never Used  . Alcohol use Not on file    FAMILY HISTORY: Family History  Problem Relation Age of Onset  . Diabetes Mother   . Hypertension Mother   . Hyperlipidemia Mother   . Thyroid disease Mother   . Depression Mother   . Anxiety disorder Mother   . Eating disorder Mother   . Obesity Mother   . Heart disease Father   . Sudden death Father     ROS: Review of Systems  Constitutional: Positive for weight loss.  Gastrointestinal: Positive for constipation.  Negative for diarrhea and melena.    PHYSICAL EXAM: Blood pressure 135/80, pulse 90, temperature 97.9 F (36.6 C), temperature source Oral, weight 189 lb (85.7 kg), SpO2 98 %. Body mass index is 33.48 kg/m. Physical Exam  Constitutional: She is oriented to person, place, and time. She appears well-developed and well-nourished.  Cardiovascular: Normal rate.   Pulmonary/Chest: Effort normal.  Musculoskeletal: Normal range of motion.  Neurological: She is oriented to person, place, and time.  Skin: Skin is warm and dry.  Psychiatric: She has a normal mood and affect. Her behavior is normal.  Vitals reviewed.   RECENT LABS AND TESTS: BMET    Component Value Date/Time   NA 139 10/09/2016 1442   K 4.1 10/09/2016 1442   CL 98 10/09/2016 1442   CO2 23 10/09/2016 1442   GLUCOSE 88 10/09/2016 1442   GLUCOSE 93 12/03/2009 2056   BUN 8 10/09/2016 1442   CREATININE 0.81 10/09/2016 1442   CALCIUM 9.3 10/09/2016 1442   GFRNONAA 84 10/09/2016 1442   GFRAA 97 10/09/2016 1442   Lab Results  Component Value Date   HGBA1C 5.4 10/09/2016   Lab Results  Component Value Date   INSULIN 14.4 10/09/2016   CBC    Component Value Date/Time   WBC 8.8 10/09/2016 1442   WBC 6.2 10/24/2008 2117   RBC 4.84 10/09/2016 1442   RBC 4.64 10/24/2008 2117   HGB 13.9 10/24/2008 2117   HCT 42.4 10/09/2016 1442   PLT 252 10/24/2008 2117   MCV 88 10/09/2016 1442   MCH 29.8 10/09/2016 1442   MCHC 34.0 10/09/2016 1442   MCHC 32.6 10/24/2008 2117   RDW 13.7 10/09/2016 1442   LYMPHSABS 2.8 10/09/2016 1442   EOSABS 0.1 10/09/2016 1442   BASOSABS 0.1 10/09/2016 1442   Iron/TIBC/Ferritin/ %Sat No results found for: IRON, TIBC, FERRITIN, IRONPCTSAT Lipid Panel     Component Value Date/Time   CHOL 223 (H) 10/09/2016 1442   TRIG 227 (H) 10/09/2016 1442   HDL 39 (L) 10/09/2016 1442   CHOLHDL 4.7 Ratio 12/03/2009 2056   VLDL 27 12/03/2009 2056   LDLCALC 139 (H) 10/09/2016 1442   Hepatic Function  Panel     Component Value Date/Time   PROT 7.9 10/09/2016 1442   ALBUMIN 4.5 10/09/2016 1442   AST 22 10/09/2016 1442   ALT 10 10/09/2016 1442   ALKPHOS 63 10/09/2016 1442   BILITOT 0.5 10/09/2016 1442      Component Value Date/Time   TSH 1.970 10/09/2016 1442   TSH 1.990 12/03/2009 2056   TSH 2.142  10/24/2008 2117    ASSESSMENT AND PLAN: Other constipation  Class 1 obesity without serious comorbidity with body mass index (BMI) of 33.0 to 33.9 in adult, unspecified obesity type  PLAN:  Constipation Latrica was informed decrease bowel movement frequency is normal while losing weight, but stools should not be hard or painful. She was advised to increase her H20 intake and work on increasing her fiber intake. High fiber foods were discussed today. She agreed to continue Miralax as directed and follow up with our clinic in 2 weeks.  We spent > than 50% of the 15 minute visit on the counseling as documented in the note.  Obesity Deema is currently in the action stage of change. As such, her goal is to continue with weight loss efforts She has agreed to follow the Category 2 plan Elva has been instructed to work up to a goal of 150 minutes of combined cardio and strengthening exercise per week or increase walking 2 miles 3 times per week for weight loss and overall health benefits. We discussed the following Behavioral Modification Stratagies today: increasing H2O, increasing lean protein intake, decreasing simple carbohydrates  and increasing fiber rich foods  Nikesha has agreed to follow up with our clinic in 2 weeks. She was informed of the importance of frequent follow up visits to maximize her success with intensive lifestyle modifications for her multiple health conditions.  I, Doreene Nest, am acting as scribe for Dennard Nip, MD  I have reviewed the above documentation for accuracy and completeness, and I agree with the above. -Dennard Nip, MD

## 2017-02-09 ENCOUNTER — Ambulatory Visit (INDEPENDENT_AMBULATORY_CARE_PROVIDER_SITE_OTHER): Payer: 59 | Admitting: Family Medicine

## 2017-02-09 VITALS — BP 118/75 | HR 73 | Temp 98.3°F | Ht 63.0 in | Wt 191.0 lb

## 2017-02-09 DIAGNOSIS — E559 Vitamin D deficiency, unspecified: Secondary | ICD-10-CM

## 2017-02-09 DIAGNOSIS — Z6833 Body mass index (BMI) 33.0-33.9, adult: Secondary | ICD-10-CM

## 2017-02-09 DIAGNOSIS — R7303 Prediabetes: Secondary | ICD-10-CM

## 2017-02-09 DIAGNOSIS — I10 Essential (primary) hypertension: Secondary | ICD-10-CM

## 2017-02-09 DIAGNOSIS — Z9189 Other specified personal risk factors, not elsewhere classified: Secondary | ICD-10-CM | POA: Diagnosis not present

## 2017-02-09 DIAGNOSIS — E669 Obesity, unspecified: Secondary | ICD-10-CM | POA: Diagnosis not present

## 2017-02-09 MED ORDER — VITAMIN D (ERGOCALCIFEROL) 1.25 MG (50000 UNIT) PO CAPS: 50000.0000 [IU] | ORAL_CAPSULE | ORAL | 0 refills | Status: DC

## 2017-02-09 MED ORDER — HYDROCHLOROTHIAZIDE 12.5 MG PO TABS: 12.5000 mg | ORAL_TABLET | Freq: Every day | ORAL | 0 refills | Status: DC

## 2017-02-09 MED ORDER — METFORMIN HCL 500 MG PO TABS: 500.0000 mg | ORAL_TABLET | Freq: Every day | ORAL | 0 refills | Status: DC

## 2017-02-09 MED FILL — metFORMIN HCL 500 MG TABS: 500 | 30 days supply | Qty: 30 | Fill #0

## 2017-02-09 MED FILL — VIT D2 1.25 MG (50,000 UNIT: 1.25 MG | 28 days supply | Qty: 4 | Fill #0

## 2017-02-09 MED FILL — HYDROCHLOROTHIAZIDE 12.5 MG: 12.5 | 30 days supply | Qty: 30 | Fill #0

## 2017-02-10 NOTE — Progress Notes (Signed)
Office: [phone]  /  Fax: [phone]   HPI:   Chief Complaint: OBESITY Kristi Huynh is here to discuss her progress with her obesity treatment plan. She is following the Category 2 plan and journaling all meals. She is following her eating plan approximately 75 % of the time. She states she is walking 3 miles 3 times per week. Kristi Huynh is up 2 lbs of H2O weight. She hasn't been journaling much, mostly portion control/smart choices. Her protein has decreased but she is ready to get back on track. Her weight is 191 lb (86.6 kg) today and has had a weight gain of 2 lbs over a period of 3 to 4 weeks since her last visit. She has lost 14 lbs since starting treatment with [name].  Vitamin D deficiency Kristi Huynh has a diagnosis of vitamin D deficiency. She is stable on vit D, not yet at goal and denies nausea, vomiting or muscle weakness.  Hypertension Kristi Huynh is a 51 y.o. female with hypertension.  Kristi Huynh denies chest pain, headache or shortness of breath on exertion. She is working weight loss to help control her blood pressure with the goal of decreasing her risk of heart attack and stroke. Kristi Huynh blood pressure is currently well controlled on HCTZ and diet.  Pre-Diabetes Kristi Huynh has a diagnosis of prediabetes based on her elevated Hgb A1c and was informed this puts her at greater risk of developing diabetes. She is stable on metformin currently and continues to work on diet and exercise to decrease risk of diabetes. She has decreased polyphagia but simple carbohydrates have been starting to creep into her diet. She denies nausea or hypoglycemia.  At risk for diabetes Kristi Huynh is at higher than average risk for developing diabetes due to her obesity and pre-diabetes. She currently denies polyuria or polydipsia.   Wt Readings from Last 500 Encounters:  02/09/17 191 lb (86.6 kg)  01/14/17 189 lb (85.7 kg)  12/31/16 193 lb (87.5 kg)  12/03/16 196 lb (88.9 kg)  11/20/16 197 lb (89.4 kg)    11/04/16 201 lb (91.2 kg)  10/22/16 202 lb (91.6 kg)  10/09/16 205 lb (93 kg)  12/03/09 187 lb (84.8 kg)  01/09/09 176 lb (79.8 kg)  11/10/08 176 lb (79.8 kg)  10/24/08 175 lb (79.4 kg)  11/15/07 178 lb (80.7 kg)     ALLERGIES: Allergies  Allergen Reactions  . Codeine   . Oxycodone Hcl   . Penicillins   . Sulfonamide Derivatives     MEDICATIONS: Current Outpatient Prescriptions on File Prior to Visit  Medication Sig Dispense Refill  . Ascorbic Acid (VITA-C PO) Take 2 tablets by mouth every morning.    . Loratadine-Pseudoephedrine (HCA ALLERGY/NASAL DECONGESTANT PO) Take 1 tablet by mouth every morning.    . Misc Natural Products (ENERGY FOCUS) TABS Take 1 tablet by mouth every morning.    . Multiple Vitamin (MULTIVITAMIN) tablet Take 1 tablet by mouth every morning.    . Omega-3 Fatty Acids (FISH OIL) 1000 MG CAPS Take 1 capsule by mouth every morning.    . polyethylene glycol powder (GLYCOLAX/MIRALAX) powder Take 17 g by mouth every morning. 3350 g 0  . Probiotic Product (PROBIOTIC-10 PO) Take 1 tablet by mouth every morning.    [name] 150 MG TABS Take 4 tablets by mouth every morning.    . vitamin B-12 (CYANOCOBALAMIN) 1000 MCG tablet Take 1,000 mcg by mouth daily.    . Wheat Dextrin (BENEFIBER) POWD Take 1 scoop  by mouth 2 (two) times daily. One TBSP BID  0   No current facility-administered medications on file prior to visit.     PAST MEDICAL HISTORY: Past Medical History:  Diagnosis Date  . Anxiety   . back pain   . Depression   . joint pain   . Swelling    feet and legs    PAST SURGICAL HISTORY: Past Surgical History:  Procedure Laterality Date  . ANKLE SURGERY     Right 2 times, once as teen and once at age 67  . CYST REMOVAL HAND     Right hand age 35 and 78  . LAPAROSCOPY     endometrosis  . TONSILLECTOMY AND ADENOIDECTOMY     51 years old    SOCIAL HISTORY: Social History  Substance Use Topics  . Smoking status: Never Smoker  .  Smokeless tobacco: Never Used  . Alcohol use Not on file    FAMILY HISTORY: Family History  Problem Relation Age of Onset  . Diabetes Mother   . Hypertension Mother   . Hyperlipidemia Mother   . Thyroid disease Mother   . Depression Mother   . Anxiety disorder Mother   . Eating disorder Mother   . Obesity Mother   . Heart disease Father   . Sudden death Father     ROS: Review of Systems  Constitutional: Negative for weight loss.  Respiratory: Negative for shortness of breath (on exertion).   Cardiovascular: Negative for chest pain.  Gastrointestinal: Negative for nausea and vomiting.  Genitourinary: Negative for frequency.  Musculoskeletal:       Negative muscle weakness  Neurological: Negative for headaches.  Endo/Heme/Allergies: Negative for polydipsia.       Polyphagia Negative hypoglycemia    PHYSICAL EXAM: Blood pressure 118/75, pulse 73, temperature 98.3 F (36.8 C), temperature source Oral, height 5\' 3"  (1.6 m), weight 191 lb (86.6 kg), SpO2 99 %. Body mass index is 33.83 kg/m. Physical Exam  Constitutional: She is oriented to person, place, and time. She appears well-developed and well-nourished.  Cardiovascular: Normal rate.   Pulmonary/Chest: Effort normal.  Musculoskeletal: Normal range of motion.  Neurological: She is oriented to person, place, and time.  Skin: Skin is warm and dry.  Psychiatric: She has a normal mood and affect. Her behavior is normal.  Vitals reviewed.   RECENT LABS AND TESTS: BMET    Component Value Date/Time   NA 139 10/09/2016 1442   K 4.1 10/09/2016 1442   CL 98 10/09/2016 1442   CO2 23 10/09/2016 1442   GLUCOSE 88 10/09/2016 1442   GLUCOSE 93 12/03/2009 2056   BUN 8 10/09/2016 1442   CREATININE 0.81 10/09/2016 1442   CALCIUM 9.3 10/09/2016 1442   GFRNONAA 84 10/09/2016 1442   GFRAA 97 10/09/2016 1442   Lab Results  Component Value Date   HGBA1C 5.4 10/09/2016   Lab Results  Component Value Date   INSULIN 14.4  10/09/2016   CBC    Component Value Date/Time   WBC 8.8 10/09/2016 1442   WBC 6.2 10/24/2008 2117   RBC 4.84 10/09/2016 1442   RBC 4.64 10/24/2008 2117   HGB 13.9 10/24/2008 2117   HCT 42.4 10/09/2016 1442   PLT 252 10/24/2008 2117   MCV 88 10/09/2016 1442   MCH 29.8 10/09/2016 1442   MCHC 34.0 10/09/2016 1442   MCHC 32.6 10/24/2008 2117   RDW 13.7 10/09/2016 1442   LYMPHSABS 2.8 10/09/2016 1442   EOSABS 0.1 10/09/2016 1442  BASOSABS 0.1 10/09/2016 1442   Iron/TIBC/Ferritin/ %Sat No results found for: IRON, TIBC, FERRITIN, IRONPCTSAT Lipid Panel     Component Value Date/Time   CHOL 223 (H) 10/09/2016 1442   TRIG 227 (H) 10/09/2016 1442   HDL 39 (L) 10/09/2016 1442   CHOLHDL 4.7 Ratio 12/03/2009 2056   VLDL 27 12/03/2009 2056   LDLCALC 139 (H) 10/09/2016 1442   Hepatic Function Panel     Component Value Date/Time   PROT 7.9 10/09/2016 1442   ALBUMIN 4.5 10/09/2016 1442   AST 22 10/09/2016 1442   ALT 10 10/09/2016 1442   ALKPHOS 63 10/09/2016 1442   BILITOT 0.5 10/09/2016 1442      Component Value Date/Time   TSH 1.970 10/09/2016 1442   TSH 1.990 12/03/2009 2056   TSH 2.142 10/24/2008 2117    ASSESSMENT AND PLAN: Prediabetes - Plan: metFORMIN (GLUCOPHAGE) 500 MG tablet  Vitamin D deficiency - Plan: Vitamin D, Ergocalciferol, (DRISDOL) 50000 units CAPS capsule  Essential hypertension - Plan: hydrochlorothiazide (HYDRODIURIL) 12.5 MG tablet  At risk for diabetes mellitus  Class 1 obesity without serious comorbidity with body mass index (BMI) of 33.0 to 33.9 in adult, unspecified obesity type  PLAN:  Vitamin D Deficiency Kristi Huynh was informed that low vitamin D levels contributes to fatigue and are associated with obesity, breast, and colon cancer. She agrees to continue to take prescription Vit D @50 ,000 IU every week and will follow up for routine testing of vitamin D, at least 2-3 times per year. She was informed of the risk of over-replacement of vitamin D  and agrees to not increase her dose unless he discusses this with [name] first. Kristi Huynh agrees to follow up with our clinic in 2 weeks.  Hypertension We discussed sodium restriction, working on healthy weight loss, and a regular exercise program as the means to achieve improved blood pressure control. Kristi Huynh agreed with this plan and agreed to follow up as directed. We will continue to monitor her blood pressure as well as her progress with the above lifestyle modifications. She agrees to continue HCTZ  as prescribed, we will refill for 1 month and will watch for signs of hypotension as she continues her lifestyle modifications.  Pre-Diabetes Kristi Huynh will continue to work on weight loss, exercise, and decreasing simple carbohydrates in her diet to help decrease the risk of diabetes. We dicussed metformin including benefits and risks. She was informed that eating too many simple carbohydrates or too many calories at one sitting increases the likelihood of GI side effects. Kristi Huynh agrees to continue to take metformin for now and a prescription was written today for 1 month refill. Kristi Huynh agreed to follow up with [name] as directed to monitor her progress.  Diabetes risk counselling Kristi Huynh was given extended (at least 15 minutes) diabetes prevention counseling today. She is 51 y.o. female and has risk factors for diabetes including obesity. We discussed intensive lifestyle modifications today with an emphasis on weight loss as well as increasing exercise and decreasing simple carbohydrates in her diet.  Obesity Kristi Huynh is currently in the action stage of change. As such, her goal is to continue with weight loss efforts She has agreed to follow the Category 2 plan Kristi Huynh has been instructed to work up to a goal of 150 minutes of combined cardio and strengthening exercise per week or continue walking 3 miles for 3 times per week for weight loss and overall health benefits. We discussed the following Behavioral Modification  Stratagies today: increasing lean protein  intake, increasing vegetables, increasing H2O and decreasing sodium intake  Kristi Huynh has agreed to follow up with our clinic in 2 weeks. She was informed of the importance of frequent follow up visits to maximize her success with intensive lifestyle modifications for her multiple health conditions.  I, Doreene Nest, am acting as scribe for Dennard Nip, MD  I have reviewed the above documentation for accuracy and completeness, and I agree with the above. -Dennard Nip, MD

## 2017-02-23 ENCOUNTER — Ambulatory Visit (INDEPENDENT_AMBULATORY_CARE_PROVIDER_SITE_OTHER): Payer: 59 | Admitting: Family Medicine

## 2017-02-23 VITALS — BP 115/71 | HR 80 | Temp 98.0°F | Ht 63.0 in | Wt 187.0 lb

## 2017-02-23 DIAGNOSIS — R7303 Prediabetes: Secondary | ICD-10-CM | POA: Diagnosis not present

## 2017-02-23 DIAGNOSIS — E7849 Other hyperlipidemia: Secondary | ICD-10-CM | POA: Insufficient documentation

## 2017-02-23 DIAGNOSIS — Z6833 Body mass index (BMI) 33.0-33.9, adult: Secondary | ICD-10-CM | POA: Diagnosis not present

## 2017-02-23 DIAGNOSIS — E784 Other hyperlipidemia: Secondary | ICD-10-CM | POA: Diagnosis not present

## 2017-02-23 DIAGNOSIS — E669 Obesity, unspecified: Secondary | ICD-10-CM | POA: Diagnosis not present

## 2017-02-23 NOTE — Progress Notes (Signed)
Office: [phone]  /  Fax: [phone]   HPI:   Chief Complaint: OBESITY Kristi Huynh is here to discuss her progress with her obesity treatment plan. She is on the  follow the Category 2 plan and is following her eating plan approximately 100 % of the time. She states she is walking 60 minutes 3 times per week. Kristi Huynh continues to do well with weight loss. She had increased temptations in the last week. She has started back with walking for exercise. Her weight is 187 lb (84.8 kg) today and has had a weight loss of 4 pounds over a period of 2 weeks since her last visit. She has lost 18 lbs since starting treatment with [name].  Pre-Diabetes Kristi Huynh has a diagnosis of prediabetes based on her elevated Hgb A1c and was informed this puts her at greater risk of developing diabetes. She is taking metformin currently and continues to work on diet and exercise to decrease risk of diabetes. She denies nausea, vomiting or hypoglycemia.  Hyperlipidemia Kristi Huynh has hyperlipidemia and has been attempting to control her cholesterol levels with intensive lifestyle modification including a low saturated fat diet, exercise and weight loss. She denies any chest pain, claudication or myalgias.   ALLERGIES: Allergies  Allergen Reactions  . Codeine   . Oxycodone Hcl   . Penicillins   . Sulfonamide Derivatives     MEDICATIONS: Current Outpatient Prescriptions on File Prior to Visit  Medication Sig Dispense Refill  . Ascorbic Acid (VITA-C PO) Take 2 tablets by mouth every morning.    . hydrochlorothiazide (HYDRODIURIL) 12.5 MG tablet Take 1 tablet (12.5 mg total) by mouth daily. 30 tablet 0  . Loratadine-Pseudoephedrine (HCA ALLERGY/NASAL DECONGESTANT PO) Take 1 tablet by mouth every morning.    . metFORMIN (GLUCOPHAGE) 500 MG tablet Take 1 tablet (500 mg total) by mouth daily with breakfast. 30 tablet 0  . Misc Natural Products (ENERGY FOCUS) TABS Take 1 tablet by mouth every morning.    . Multiple Vitamin  (MULTIVITAMIN) tablet Take 1 tablet by mouth every morning.    . Omega-3 Fatty Acids (FISH OIL) 1000 MG CAPS Take 1 capsule by mouth every morning.    . polyethylene glycol powder (GLYCOLAX/MIRALAX) powder Take 17 g by mouth every morning. 3350 g 0  . Probiotic Product (PROBIOTIC-10 PO) Take 1 tablet by mouth every morning.    [name] 150 MG TABS Take 4 tablets by mouth every morning.    . vitamin B-12 (CYANOCOBALAMIN) 1000 MCG tablet Take 1,000 mcg by mouth daily.    . Vitamin D, Ergocalciferol, (DRISDOL) 50000 units CAPS capsule Take 1 capsule (50,000 Units total) by mouth every 7 (seven) days. 4 capsule 0  . Wheat Dextrin (BENEFIBER) POWD Take 1 scoop by mouth 2 (two) times daily. One TBSP BID  0   No current facility-administered medications on file prior to visit.     PAST MEDICAL HISTORY: Past Medical History:  Diagnosis Date  . Anxiety   . back pain   . Depression   . joint pain   . Swelling    feet and legs    PAST SURGICAL HISTORY: Past Surgical History:  Procedure Laterality Date  . ANKLE SURGERY     Right 2 times, once as teen and once at age 59  . CYST REMOVAL HAND     Right hand age 30 and 2  . LAPAROSCOPY     endometrosis  . TONSILLECTOMY AND ADENOIDECTOMY     51 years  old    SOCIAL HISTORY: Social History  Substance Use Topics  . Smoking status: Never Smoker  . Smokeless tobacco: Never Used  . Alcohol use Not on file    FAMILY HISTORY: Family History  Problem Relation Age of Onset  . Diabetes Mother   . Hypertension Mother   . Hyperlipidemia Mother   . Thyroid disease Mother   . Depression Mother   . Anxiety disorder Mother   . Eating disorder Mother   . Obesity Mother   . Heart disease Father   . Sudden death Father     ROS: Review of Systems  Constitutional: Positive for weight loss.  Cardiovascular: Negative for chest pain and claudication.  Gastrointestinal: Negative for nausea and vomiting.  Musculoskeletal: Negative for  myalgias.  Endo/Heme/Allergies:       Negative hypoglycemia    PHYSICAL EXAM: Blood pressure 115/71, pulse 80, temperature 98 F (36.7 C), temperature source Oral, height 5\' 3"  (1.6 m), weight 187 lb (84.8 kg), SpO2 99 %. Body mass index is 33.13 kg/m. Physical Exam  Constitutional: She is oriented to person, place, and time. She appears well-developed and well-nourished.  Cardiovascular: Normal rate.   Pulmonary/Chest: Effort normal.  Musculoskeletal: Normal range of motion.  Neurological: She is oriented to person, place, and time.  Skin: Skin is warm and dry.  Psychiatric: She has a normal mood and affect. Her behavior is normal.  Vitals reviewed.   RECENT LABS AND TESTS: BMET    Component Value Date/Time   NA 139 10/09/2016 1442   K 4.1 10/09/2016 1442   CL 98 10/09/2016 1442   CO2 23 10/09/2016 1442   GLUCOSE 88 10/09/2016 1442   GLUCOSE 93 12/03/2009 2056   BUN 8 10/09/2016 1442   CREATININE 0.81 10/09/2016 1442   CALCIUM 9.3 10/09/2016 1442   GFRNONAA 84 10/09/2016 1442   GFRAA 97 10/09/2016 1442   Lab Results  Component Value Date   HGBA1C 5.4 10/09/2016   Lab Results  Component Value Date   INSULIN 14.4 10/09/2016   CBC    Component Value Date/Time   WBC 8.8 10/09/2016 1442   WBC 6.2 10/24/2008 2117   RBC 4.84 10/09/2016 1442   RBC 4.64 10/24/2008 2117   HGB 13.9 10/24/2008 2117   HCT 42.4 10/09/2016 1442   PLT 252 10/24/2008 2117   MCV 88 10/09/2016 1442   MCH 29.8 10/09/2016 1442   MCHC 34.0 10/09/2016 1442   MCHC 32.6 10/24/2008 2117   RDW 13.7 10/09/2016 1442   LYMPHSABS 2.8 10/09/2016 1442   EOSABS 0.1 10/09/2016 1442   BASOSABS 0.1 10/09/2016 1442   Iron/TIBC/Ferritin/ %Sat No results found for: IRON, TIBC, FERRITIN, IRONPCTSAT Lipid Panel     Component Value Date/Time   CHOL 223 (H) 10/09/2016 1442   TRIG 227 (H) 10/09/2016 1442   HDL 39 (L) 10/09/2016 1442   CHOLHDL 4.7 Ratio 12/03/2009 2056   VLDL 27 12/03/2009 2056   LDLCALC  139 (H) 10/09/2016 1442   Hepatic Function Panel     Component Value Date/Time   PROT 7.9 10/09/2016 1442   ALBUMIN 4.5 10/09/2016 1442   AST 22 10/09/2016 1442   ALT 10 10/09/2016 1442   ALKPHOS 63 10/09/2016 1442   BILITOT 0.5 10/09/2016 1442      Component Value Date/Time   TSH 1.970 10/09/2016 1442   TSH 1.990 12/03/2009 2056   TSH 2.142 10/24/2008 2117    ASSESSMENT AND PLAN: Prediabetes  Other hyperlipidemia  Class 1 obesity  without serious comorbidity with body mass index (BMI) of 33.0 to 33.9 in adult, unspecified obesity type  PLAN:  Pre-Diabetes Sherryl will continue to work on weight loss, exercise, and decreasing simple carbohydrates in her diet to help decrease the risk of diabetes. We dicussed metformin including benefits and risks. She was informed that eating too many simple carbohydrates or too many calories at one sitting increases the likelihood of GI side effects. Laila agrees to continue metformin for now and Dierdra agreed to follow up with [name] as directed to monitor her progress.  Hyperlipidemia Kristi Huynh was informed of the American Heart Association Guidelines emphasizing intensive lifestyle modifications as the first line treatment for hyperlipidemia. We discussed many lifestyle modifications today in depth, and Kristi Huynh will continue to work on decreasing saturated fats such as fatty red meat, butter and many fried foods. She will also increase vegetables and lean protein in her diet and continue to work on exercise and weight loss efforts. We will re-check labs in 2 weeks and Venda agrees to follow up with our clinic in 2 weeks.  We spent > than 50% of the 15 minute visit on the counseling as documented in the note.  Obesity Kristi Huynh is currently in the action stage of change. As such, her goal is to continue with weight loss efforts She has agreed to follow the Category 2 plan Kristi Huynh has been instructed to work up to a goal of 150 minutes of combined cardio and  strengthening exercise per week for weight loss and overall health benefits. We discussed the following Behavioral Modification Strategies today: increasing lean protein intake and decreasing simple carbohydrates   Kristi Huynh has agreed to follow up with our clinic in 2 to 3 weeks. She was informed of the importance of frequent follow up visits to maximize her success with intensive lifestyle modifications for her multiple health conditions.  I, Doreene Nest, am acting as scribe for Dennard Nip, MD  I have reviewed the above documentation for accuracy and completeness, and I agree with the above. -Dennard Nip, MD

## 2017-03-11 ENCOUNTER — Ambulatory Visit (INDEPENDENT_AMBULATORY_CARE_PROVIDER_SITE_OTHER): Payer: 59 | Admitting: Family Medicine

## 2017-03-11 VITALS — BP 111/72 | HR 74 | Temp 98.1°F | Ht 63.0 in | Wt 187.0 lb

## 2017-03-11 DIAGNOSIS — R7303 Prediabetes: Secondary | ICD-10-CM

## 2017-03-11 DIAGNOSIS — E784 Other hyperlipidemia: Secondary | ICD-10-CM | POA: Diagnosis not present

## 2017-03-11 DIAGNOSIS — E669 Obesity, unspecified: Secondary | ICD-10-CM

## 2017-03-11 DIAGNOSIS — Z6833 Body mass index (BMI) 33.0-33.9, adult: Secondary | ICD-10-CM | POA: Diagnosis not present

## 2017-03-11 DIAGNOSIS — K5909 Other constipation: Secondary | ICD-10-CM | POA: Diagnosis not present

## 2017-03-11 DIAGNOSIS — E66811 Obesity, class 1: Secondary | ICD-10-CM

## 2017-03-11 DIAGNOSIS — E559 Vitamin D deficiency, unspecified: Secondary | ICD-10-CM | POA: Diagnosis not present

## 2017-03-11 DIAGNOSIS — Z9189 Other specified personal risk factors, not elsewhere classified: Secondary | ICD-10-CM | POA: Diagnosis not present

## 2017-03-11 DIAGNOSIS — E7849 Other hyperlipidemia: Secondary | ICD-10-CM

## 2017-03-11 DIAGNOSIS — I10 Essential (primary) hypertension: Secondary | ICD-10-CM

## 2017-03-11 MED ORDER — HYDROCHLOROTHIAZIDE 12.5 MG PO TABS: 12.5000 mg | ORAL_TABLET | Freq: Every day | ORAL | 0 refills | Status: DC

## 2017-03-11 MED ORDER — METFORMIN HCL 500 MG PO TABS: 500.0000 mg | ORAL_TABLET | Freq: Every day | ORAL | 0 refills | Status: DC

## 2017-03-11 MED ORDER — VITAMIN D (ERGOCALCIFEROL) 1.25 MG (50000 UNIT) PO CAPS: 50000.0000 [IU] | ORAL_CAPSULE | ORAL | 0 refills | Status: DC

## 2017-03-11 MED FILL — HYDROCHLOROTHIAZIDE 12.5 MG: 12.5 | 30 days supply | Qty: 30 | Fill #0

## 2017-03-11 MED FILL — metFORMIN HCL 500 MG TABS: 500 | 30 days supply | Qty: 30 | Fill #0

## 2017-03-11 MED FILL — VIT D2 1.25 MG (50,000 UNIT: 1.25 MG | 28 days supply | Qty: 4 | Fill #0

## 2017-03-11 NOTE — Progress Notes (Signed)
Office: [phone]  /  Fax: [phone]   HPI:   Chief Complaint: OBESITY Kristi Huynh is here to discuss her progress with her obesity treatment plan. She is on the  follow the Category 2 plan and is following her eating plan approximately 85 % of the time. She states she is walking-6 miles a week 60 minutes 3 times per week. Kristi Huynh did well with maintaining weight. She had some celebration eating in the last 2 weeks. She is trying to make better choices overall. Her weight is 187 lb (84.8 kg) today and has maintained weight over a period of 2 weeks since her last visit. She has lost 22 lbs since starting treatment with [name].  Vitamin D deficiency Kristi Huynh has a diagnosis of vitamin D deficiency. She is currently stable on vit D and denies nausea, vomiting or muscle weakness. Kristi Huynh is due for labs.  Hypertension Kristi Huynh is a 51 y.o. female with hypertension.  Kristi Huynh denies chest pain, lightheadedness or shortness of breath on exertion. She is working weight loss to help control her blood pressure with the goal of decreasing her risk of heart attack and stroke. Tishas blood pressure is currently controlled.  Pre-Diabetes Kristi Huynh has a diagnosis of pre-diabetes based on her elevated Hgb A1c and was informed this puts her at greater risk of developing diabetes. She is stable on metformin and continues to work on diet and exercise to decrease risk of diabetes. She denies nausea, vomiting or hypoglycemia.  At risk for diabetes Kristi Huynh is at higher than average risk for developing diabetes due to her obesity and pre-diabetes. She currently denies polyuria or polydipsia.  Constipation Kristi Huynh notes constipation worsening, off Miralax. She states BM are less frequent and are hard and painful. She admits abdominal cramping and she denies hematochezia or melena.   Hyperlipidemia Kristi Huynh has hyperlipidemia and has been attempting to control her cholesterol levels with intensive lifestyle  modification including a low saturated fat diet, exercise and weight loss. She denies any chest pain, claudication or myalgias.  ALLERGIES: Allergies  Allergen Reactions  . Codeine   . Oxycodone Hcl   . Penicillins   . Sulfonamide Derivatives     MEDICATIONS: Current Outpatient Prescriptions on File Prior to Visit  Medication Sig Dispense Refill  . Ascorbic Acid (VITA-C PO) Take 2 tablets by mouth every morning.    . Loratadine-Pseudoephedrine (HCA ALLERGY/NASAL DECONGESTANT PO) Take 1 tablet by mouth every morning.    . Misc Natural Products (ENERGY FOCUS) TABS Take 1 tablet by mouth every morning.    . Multiple Vitamin (MULTIVITAMIN) tablet Take 1 tablet by mouth every morning.    . Omega-3 Fatty Acids (FISH OIL) 1000 MG CAPS Take 1 capsule by mouth every morning.    . polyethylene glycol powder (GLYCOLAX/MIRALAX) powder Take 17 g by mouth every morning. 3350 g 0  . Probiotic Product (PROBIOTIC-10 PO) Take 1 tablet by mouth every morning.    Kristi Huynh [name] 150 MG TABS Take 4 tablets by mouth every morning.    . vitamin B-12 (CYANOCOBALAMIN) 1000 MCG tablet Take 1,000 mcg by mouth daily.    . Wheat Dextrin (BENEFIBER) POWD Take 1 scoop by mouth 2 (two) times daily. One TBSP BID  0   No current facility-administered medications on file prior to visit.     PAST MEDICAL HISTORY: Past Medical History:  Diagnosis Date  . Anxiety   . back pain   . Depression   . joint pain   .  Swelling    feet and legs    PAST SURGICAL HISTORY: Past Surgical History:  Procedure Laterality Date  . ANKLE SURGERY     Right 2 times, once as teen and once at age 41  . CYST REMOVAL HAND     Right hand age 64 and 24  . LAPAROSCOPY     endometrosis  . TONSILLECTOMY AND ADENOIDECTOMY     51 years old    SOCIAL HISTORY: Social History  Substance Use Topics  . Smoking status: Never Smoker  . Smokeless tobacco: Never Used  . Alcohol use Not on file    FAMILY HISTORY: Family History    Problem Relation Age of Onset  . Diabetes Mother   . Hypertension Mother   . Hyperlipidemia Mother   . Thyroid disease Mother   . Depression Mother   . Anxiety disorder Mother   . Eating disorder Mother   . Obesity Mother   . Heart disease Father   . Sudden death Father     ROS: Review of Systems  Constitutional: Negative for weight loss.  Respiratory: Negative for shortness of breath (on exertion).   Cardiovascular: Negative for chest pain and claudication.  Gastrointestinal: Positive for constipation. Negative for melena, nausea and vomiting.       Abdominal Cramping  Genitourinary: Negative for frequency.  Musculoskeletal: Negative for myalgias.       Negative muscle weakness  Neurological:       Negative lightheadedness  Endo/Heme/Allergies: Negative for polydipsia.       Negative hypoglycemia    PHYSICAL EXAM: Blood pressure 111/72, pulse 74, temperature 98.1 F (36.7 C), temperature source Oral, height 5\' 3"  (1.6 m), weight 187 lb (84.8 kg), SpO2 99 %. Body mass index is 33.13 kg/m. Physical Exam  Constitutional: She is oriented to person, place, and time. She appears well-developed and well-nourished.  Cardiovascular: Normal rate.   Pulmonary/Chest: Effort normal.  Musculoskeletal: Normal range of motion.  Neurological: She is oriented to person, place, and time.  Skin: Skin is warm and dry.  Psychiatric: She has a normal mood and affect. Her behavior is normal.  Vitals reviewed.   RECENT LABS AND TESTS: BMET    Component Value Date/Time   NA 139 10/09/2016 1442   K 4.1 10/09/2016 1442   CL 98 10/09/2016 1442   CO2 23 10/09/2016 1442   GLUCOSE 88 10/09/2016 1442   GLUCOSE 93 12/03/2009 2056   BUN 8 10/09/2016 1442   CREATININE 0.81 10/09/2016 1442   CALCIUM 9.3 10/09/2016 1442   GFRNONAA 84 10/09/2016 1442   GFRAA 97 10/09/2016 1442   Lab Results  Component Value Date   HGBA1C 5.4 10/09/2016   Lab Results  Component Value Date   INSULIN 14.4  10/09/2016   CBC    Component Value Date/Time   WBC 8.8 10/09/2016 1442   WBC 6.2 10/24/2008 2117   RBC 4.84 10/09/2016 1442   RBC 4.64 10/24/2008 2117   HGB 13.9 10/24/2008 2117   HCT 42.4 10/09/2016 1442   PLT 252 10/24/2008 2117   MCV 88 10/09/2016 1442   MCH 29.8 10/09/2016 1442   MCHC 34.0 10/09/2016 1442   MCHC 32.6 10/24/2008 2117   RDW 13.7 10/09/2016 1442   LYMPHSABS 2.8 10/09/2016 1442   EOSABS 0.1 10/09/2016 1442   BASOSABS 0.1 10/09/2016 1442   Iron/TIBC/Ferritin/ %Sat No results found for: IRON, TIBC, FERRITIN, IRONPCTSAT Lipid Panel     Component Value Date/Time   CHOL 223 (H) 10/09/2016  1442   TRIG 227 (H) 10/09/2016 1442   HDL 39 (L) 10/09/2016 1442   CHOLHDL 4.7 Ratio 12/03/2009 2056   VLDL 27 12/03/2009 2056   LDLCALC 139 (H) 10/09/2016 1442   Hepatic Function Panel     Component Value Date/Time   PROT 7.9 10/09/2016 1442   ALBUMIN 4.5 10/09/2016 1442   AST 22 10/09/2016 1442   ALT 10 10/09/2016 1442   ALKPHOS 63 10/09/2016 1442   BILITOT 0.5 10/09/2016 1442      Component Value Date/Time   TSH 1.970 10/09/2016 1442   TSH 1.990 12/03/2009 2056   TSH 2.142 10/24/2008 2117    ASSESSMENT AND PLAN: Essential hypertension - Plan: Comprehensive metabolic panel, hydrochlorothiazide (HYDRODIURIL) 12.5 MG tablet  Vitamin D deficiency - Plan: VITAMIN D 25 Hydroxy (Vit-D Deficiency, Fractures), Vitamin D, Ergocalciferol, (DRISDOL) 50000 units CAPS capsule  Prediabetes - Plan: Hemoglobin A1c, Insulin, random, metFORMIN (GLUCOPHAGE) 500 MG tablet  Other constipation  Other hyperlipidemia - Plan: Lipid Panel With LDL/HDL Ratio  At risk for diabetes mellitus  Class 1 obesity without serious comorbidity with body mass index (BMI) of 33.0 to 33.9 in adult, unspecified obesity type  PLAN:  Vitamin D Deficiency Kristi Huynh was informed that low vitamin D levels contributes to fatigue and are associated with obesity, breast, and colon cancer. She agrees to  continue to take prescription Vit D @50 ,000 IU every week, we will refill for 1 month and will check labs and will follow up for routine testing of vitamin D, at least 2-3 times per year. She was informed of the risk of over-replacement of vitamin D and agrees to not increase her dose unless he discusses this with [name] first. Kristi Huynh agrees to follow up with our clinic in 2 weeks.  Hypertension We discussed sodium restriction, working on healthy weight loss, and a regular exercise program as the means to achieve improved blood pressure control. Kristi Huynh agreed with this plan and agreed to follow up as directed. We will continue to monitor her blood pressure as well as her progress with the above lifestyle modifications. She agrees to continue HCTZ as prescribed, we will refill for 1 month and she will watch for signs of hypotension as she continues her lifestyle modifications. We will check labs and Kristi Huynh agrees to follow up with our clinic in 2 weeks.  Pre-Diabetes Kristi Huynh will continue to work on weight loss, exercise, and decreasing simple carbohydrates in her diet to help decrease the risk of diabetes. We dicussed metformin including benefits and risks. She was informed that eating too many simple carbohydrates or too many calories at one sitting increases the likelihood of GI side effects. Telisa requested metformin for now and a prescription was written today for 1 month refill. We will check labs and Kristi Huynh agreed to follow up with [name] as directed to monitor her progress.  Diabetes risk counselling Kristi Huynh was given extended (at least 15 minutes) diabetes prevention counseling today. She is 51 y.o. female and has risk factors for diabetes including obesity and pre-diabetes. We discussed intensive lifestyle modifications today with an emphasis on weight loss as well as increasing exercise and decreasing simple carbohydrates in her diet.  Constipation Kristi Huynh was informed decrease bowel movement frequency is normal  while losing weight, but stools should not be hard or painful. She was advised to increase her H20 intake and work on increasing her fiber intake. High fiber foods were discussed today. Kristi Huynh agrees to take OTC Miralax17 grams daily until symptoms resolve and  will follow up with our clinic in 2 weeks.  Hyperlipidemia Kristi Huynh was informed of the American Heart Association Guidelines emphasizing intensive lifestyle modifications as the first line treatment for hyperlipidemia. We discussed many lifestyle modifications today in depth, and Ferol will continue to work on decreasing saturated fats such as fatty red meat, butter and many fried foods. She will also increase vegetables and lean protein in her diet and continue to work on exercise and weight loss efforts. We will check labs and Brandon agrees to follow up with our clinic in 2 weeks.  Obesity Kristi Huynh is currently in the action stage of change. As such, her goal is to continue with weight loss efforts She has agreed to follow the Category 2 plan Kristi Huynh has been instructed to work up to a goal of 150 minutes of combined cardio and strengthening exercise per week for weight loss and overall health benefits. We discussed the following Behavioral Modification Strategies today: increasing lean protein intake, decreasing simple carbohydrates  and holiday eating strategies   Kristi Huynh has agreed to follow up with our clinic in 2 weeks. She was informed of the importance of frequent follow up visits to maximize her success with intensive lifestyle modifications for her multiple health conditions.  I, Doreene Nest, am acting as scribe for Dennard Nip, MD  I have reviewed the above documentation for accuracy and completeness, and I agree with the above. -Dennard Nip, MD  OBESITY BEHAVIORAL INTERVENTION VISIT  Today's visit was # 10 out of 22.  Starting weight: 205 lbs Starting date: 10/09/16 Today's weight : 187 lbs Today's date: 03/11/2017 Total lbs  lost to date: 26 (Patients must lose 7 lbs in the first 6 months to continue with counseling)   ASK: We discussed the diagnosis of obesity with Kristi Huynh today and Shakinah agreed to give [name] permission to discuss obesity behavioral modification therapy today.  ASSESS: Drinda has the diagnosis of obesity and her BMI today is 33.2 Kristi Huynh is in the action stage of change   ADVISE: Kristi Huynh was educated on the multiple health risks of obesity as well as the benefit of weight loss to improve her health. She was advised of the need for long term treatment and the importance of lifestyle modifications.  AGREE: Multiple dietary modification options and treatment options were discussed and  Kristi Huynh agreed to follow the Category 2 plan We discussed the following Behavioral Modification Strategies today: increasing lean protein intake, decreasing simple carbohydrates  and holiday eating strategies

## 2017-03-12 LAB — HEMOGLOBIN A1C
ESTIMATED AVERAGE GLUCOSE: 114 mg/dL
Hgb A1c MFr Bld: 5.6 % (ref 4.8–5.6)

## 2017-03-12 LAB — COMPREHENSIVE METABOLIC PANEL
A/G RATIO: 1.5 (ref 1.2–2.2)
ALT: 19 IU/L (ref 0–32)
AST: 27 IU/L (ref 0–40)
Albumin: 4.5 g/dL (ref 3.5–5.5)
Alkaline Phosphatase: 69 IU/L (ref 39–117)
BUN/Creatinine Ratio: 18 (ref 9–23)
BUN: 13 mg/dL (ref 6–24)
Bilirubin Total: 0.6 mg/dL (ref 0.0–1.2)
CHLORIDE: 97 mmol/L (ref 96–106)
CO2: 26 mmol/L (ref 18–29)
Calcium: 9.5 mg/dL (ref 8.7–10.2)
Creatinine, Ser: 0.74 mg/dL (ref 0.57–1.00)
GFR calc Af Amer: 108 mL/min/{1.73_m2} (ref >59)
GFR calc non Af Amer: 93 mL/min/{1.73_m2} (ref >59)
GLOBULIN, TOTAL: 3.1 g/dL (ref 1.5–4.5)
Glucose: 101 mg/dL — ABNORMAL HIGH (ref 65–99)
POTASSIUM: 3.8 mmol/L (ref 3.5–5.2)
SODIUM: 140 mmol/L (ref 134–144)
Total Protein: 7.6 g/dL (ref 6.0–8.5)

## 2017-03-12 LAB — LIPID PANEL WITH LDL/HDL RATIO
CHOLESTEROL TOTAL: 181 mg/dL (ref 100–199)
HDL: 37 mg/dL — AB (ref >39)
LDL Calculated: 99 mg/dL (ref 0–99)
LDl/HDL Ratio: 2.7 ratio (ref 0.0–3.2)
Triglycerides: 224 mg/dL — ABNORMAL HIGH (ref 0–149)
VLDL CHOLESTEROL CAL: 45 mg/dL — AB (ref 5–40)

## 2017-03-12 LAB — INSULIN, RANDOM: INSULIN: 21.3 u[IU]/mL (ref 2.6–24.9)

## 2017-03-12 LAB — VITAMIN D 25 HYDROXY (VIT D DEFICIENCY, FRACTURES): Vit D, 25-Hydroxy: 44.4 ng/mL (ref 30.0–100.0)

## 2017-03-25 ENCOUNTER — Ambulatory Visit (INDEPENDENT_AMBULATORY_CARE_PROVIDER_SITE_OTHER): Payer: 59 | Admitting: Family Medicine

## 2017-03-25 VITALS — BP 102/67 | HR 82 | Temp 98.7°F | Ht 63.0 in | Wt 185.0 lb

## 2017-03-25 DIAGNOSIS — R7303 Prediabetes: Secondary | ICD-10-CM

## 2017-03-25 DIAGNOSIS — Z6832 Body mass index (BMI) 32.0-32.9, adult: Secondary | ICD-10-CM

## 2017-03-25 DIAGNOSIS — G4709 Other insomnia: Secondary | ICD-10-CM

## 2017-03-25 DIAGNOSIS — E669 Obesity, unspecified: Secondary | ICD-10-CM | POA: Diagnosis not present

## 2017-03-25 DIAGNOSIS — Z9189 Other specified personal risk factors, not elsewhere classified: Secondary | ICD-10-CM | POA: Diagnosis not present

## 2017-03-25 MED ORDER — METFORMIN HCL 500 MG PO TABS: 500.0000 mg | ORAL_TABLET | Freq: Two times a day (BID) | ORAL | 0 refills | Status: DC

## 2017-03-25 MED ORDER — MELATONIN 10 MG PO TABS: 1.0000 | ORAL_TABLET | Freq: Every day | ORAL | Status: AC

## 2017-03-25 NOTE — Progress Notes (Signed)
Office: [phone]  /  Fax: [phone]   HPI:   Chief Complaint: OBESITY Kristi Huynh is here to discuss her progress with her obesity treatment plan. She is on the  follow the Category 2 plan and is following her eating plan approximately 95 % of the time. She states she is walking 6 miles for 2 hours.Kristi Huynh continues to do well with weight loss on category 2 plan. She has not been getting enough sleep due to increase in family stress. Her weight is 185 lb (83.9 kg) today and has had a weight loss of 2 pounds over a period of 2 weeks since her last visit. She has lost 20 lbs since starting treatment with [name].  Pre-Diabetes Kristi Huynh has a diagnosis of pre-diabetes based on her elevated Hgb A1c and was informed this puts her at greater risk of developing diabetes. Her A1c has improved to 5.6 She is not taking metformin currently and continues to work on diet and exercise to decrease risk of diabetes. She denies nausea, polyphagia or hypoglycemia.  At risk for diabetes Kristi Huynh is at higher than average risk for developing diabetes due to her obesity and pre-diabetes. She currently denies polyuria or polydipsia.  Insomnia Kristi Huynh admits to increased stress at home and gets approximately 6 hours of sleep and has trouble going back to sleep.  ALLERGIES: Allergies  Allergen Reactions  . Codeine   . Oxycodone Hcl   . Penicillins   . Sulfonamide Derivatives     MEDICATIONS: Current Outpatient Prescriptions on File Prior to Visit  Medication Sig Dispense Refill  . Ascorbic Acid (VITA-C PO) Take 2 tablets by mouth every morning.    . hydrochlorothiazide (HYDRODIURIL) 12.5 MG tablet Take 1 tablet (12.5 mg total) by mouth daily. 30 tablet 0  . Loratadine-Pseudoephedrine (HCA ALLERGY/NASAL DECONGESTANT PO) Take 1 tablet by mouth every morning.    . Misc Natural Products (ENERGY FOCUS) TABS Take 1 tablet by mouth every morning.    . Multiple Vitamin (MULTIVITAMIN) tablet Take 1 tablet by mouth every  morning.    . Omega-3 Fatty Acids (FISH OIL) 1000 MG CAPS Take 1 capsule by mouth every morning.    . polyethylene glycol powder (GLYCOLAX/MIRALAX) powder Take 17 g by mouth every morning. 3350 g 0  . Probiotic Product (PROBIOTIC-10 PO) Take 1 tablet by mouth every morning.    Kristi Huynh Kristi Huynh 150 MG TABS Take 4 tablets by mouth every morning.    . vitamin B-12 (CYANOCOBALAMIN) 1000 MCG tablet Take 1,000 mcg by mouth daily.    . Vitamin D, Ergocalciferol, (DRISDOL) 50000 units CAPS capsule Take 1 capsule (50,000 Units total) by mouth every 7 (seven) days. 4 capsule 0  . Wheat Dextrin (BENEFIBER) POWD Take 1 scoop by mouth 2 (two) times daily. One TBSP BID  0   No current facility-administered medications on file prior to visit.     PAST MEDICAL HISTORY: Past Medical History:  Diagnosis Date  . Anxiety   . back pain   . Depression   . joint pain   . Swelling    feet and legs    PAST SURGICAL HISTORY: Past Surgical History:  Procedure Laterality Date  . ANKLE SURGERY     Right 2 times, once as teen and once at age 43  . CYST REMOVAL HAND     Right hand age 62 and 52  . LAPAROSCOPY     endometrosis  . TONSILLECTOMY AND ADENOIDECTOMY     51 years old  SOCIAL HISTORY: Social History  Substance Use Topics  . Smoking status: Never Smoker  . Smokeless tobacco: Never Used  . Alcohol use Not on file    FAMILY HISTORY: Family History  Problem Relation Age of Onset  . Diabetes Mother   . Hypertension Mother   . Hyperlipidemia Mother   . Thyroid disease Mother   . Depression Mother   . Anxiety disorder Mother   . Eating disorder Mother   . Obesity Mother   . Heart disease Father   . Sudden death Father     ROS: Review of Systems  Constitutional: Positive for weight loss.  Gastrointestinal: Negative for nausea.  Genitourinary: Negative for frequency.  Endo/Heme/Allergies: Negative for polydipsia.       Negative hypoglycemia Negative polyphagia    Psychiatric/Behavioral: The patient has insomnia.     PHYSICAL EXAM: Blood pressure 102/67, pulse 82, temperature 98.7 F (37.1 C), temperature source Oral, height 5\' 3"  (1.6 m), weight 185 lb (83.9 kg), SpO2 97 %. Body mass index is 32.77 kg/m. Physical Exam  RECENT LABS AND TESTS: BMET    Component Value Date/Time   NA 140 03/11/2017 0816   K 3.8 03/11/2017 0816   CL 97 03/11/2017 0816   CO2 26 03/11/2017 0816   GLUCOSE 101 (H) 03/11/2017 0816   GLUCOSE 93 12/03/2009 2056   BUN 13 03/11/2017 0816   CREATININE 0.74 03/11/2017 0816   CALCIUM 9.5 03/11/2017 0816   GFRNONAA 93 03/11/2017 0816   GFRAA 108 03/11/2017 0816   Lab Results  Component Value Date   HGBA1C 5.6 03/11/2017   HGBA1C 5.4 10/09/2016   Lab Results  Component Value Date   INSULIN 21.3 03/11/2017   INSULIN 14.4 10/09/2016   CBC    Component Value Date/Time   WBC 8.8 10/09/2016 1442   WBC 6.2 10/24/2008 2117   RBC 4.84 10/09/2016 1442   RBC 4.64 10/24/2008 2117   HGB 14.4 10/09/2016 1442   HCT 42.4 10/09/2016 1442   PLT 252 10/24/2008 2117   MCV 88 10/09/2016 1442   MCH 29.8 10/09/2016 1442   MCHC 34.0 10/09/2016 1442   MCHC 32.6 10/24/2008 2117   RDW 13.7 10/09/2016 1442   LYMPHSABS 2.8 10/09/2016 1442   EOSABS 0.1 10/09/2016 1442   BASOSABS 0.1 10/09/2016 1442   Iron/TIBC/Ferritin/ %Sat No results found for: IRON, TIBC, FERRITIN, IRONPCTSAT Lipid Panel     Component Value Date/Time   CHOL 181 03/11/2017 0816   TRIG 224 (H) 03/11/2017 0816   HDL 37 (L) 03/11/2017 0816   CHOLHDL 4.7 Ratio 12/03/2009 2056   VLDL 27 12/03/2009 2056   LDLCALC 99 03/11/2017 0816   Hepatic Function Panel     Component Value Date/Time   PROT 7.6 03/11/2017 0816   ALBUMIN 4.5 03/11/2017 0816   AST 27 03/11/2017 0816   ALT 19 03/11/2017 0816   ALKPHOS 69 03/11/2017 0816   BILITOT 0.6 03/11/2017 0816      Component Value Date/Time   TSH 1.970 10/09/2016 1442   TSH 1.990 12/03/2009 2056   TSH  2.142 10/24/2008 2117    ASSESSMENT AND PLAN: Prediabetes - Plan: metFORMIN (GLUCOPHAGE) 500 MG tablet  Other insomnia - Plan: Melatonin 10 MG TABS  At risk for diabetes mellitus  Class 1 obesity without serious comorbidity with body mass index (BMI) of 32.0 to 32.9 in adult, unspecified obesity type  PLAN:  Pre-Diabetes Kristi Huynh will continue to work on weight loss, exercise, and decreasing simple carbohydrates in her diet  to help decrease the risk of diabetes. We dicussed metformin including benefits and risks. She was informed that eating too many simple carbohydrates or too many calories at one sitting increases the likelihood of GI side effects. Kristi Huynh requested metformin for now and a prescription was written today for metformin 500 mg bid #60 with no refills. Kristi Huynh agreed to follow up with [name] as directed to monitor her progress.  Diabetes risk counselling Kristi Huynh was given extended (at least 15 minutes) diabetes prevention counseling today. She is 51 y.o. female and has risk factors for diabetes including obesity and pre-diabetes. We discussed intensive lifestyle modifications today with an emphasis on weight loss as well as increasing exercise and decreasing simple carbohydrates in her diet.  Insomnia Kristi Huynh agrees to start OTC Melatonin 10 mg once daily and follow up with our clinic in 2 to 3 weeks.  Obesity Kristi Huynh is currently in the action stage of change. As such, her goal is to continue with weight loss efforts She has agreed to follow the Category 2 plan Kristi Huynh has been instructed to work up to a goal of 150 minutes of combined cardio and strengthening exercise per week for weight loss and overall health benefits. We discussed the following Behavioral Modification Strategies today: increasing lean protein intake and increase H2O intake  Kristi Huynh has agreed to follow up with our clinic in 2 to 3 weeks. She was informed of the importance of frequent follow up visits to maximize her  success with intensive lifestyle modifications for her multiple health conditions.  I, Doreene Nest, am acting as transcriptionist for Dennard Nip, MD  I have reviewed the above documentation for accuracy and completeness, and I agree with the above. -Dennard Nip, MD  OBESITY BEHAVIORAL INTERVENTION VISIT  Today's visit was # 11 out of 22.  Starting weight: 205 lbs Starting date: 10/09/17 Today's weight : 185 lbs Today's date: 03/25/2017 Total lbs lost to date: 20 (Patients must lose 7 lbs in the first 6 months to continue with counseling)   ASK: We discussed the diagnosis of obesity with Kristi Huynh today and Kristi Huynh agreed to give [name] permission to discuss obesity behavioral modification therapy today.  ASSESS: Kristi Huynh has the diagnosis of obesity and her BMI today is 32.8 Kristi Huynh is in the action stage of change   ADVISE: Kristi Huynh was educated on the multiple health risks of obesity as well as the benefit of weight loss to improve her health. She was advised of the need for long term treatment and the importance of lifestyle modifications.  AGREE: Multiple dietary modification options and treatment options were discussed and  Kristi Huynh agreed to follow the Category 2 plan We discussed the following Behavioral Modification Strategies today: increasing lean protein intake and increase H2O intake

## 2017-04-09 ENCOUNTER — Other Ambulatory Visit (INDEPENDENT_AMBULATORY_CARE_PROVIDER_SITE_OTHER): Payer: Self-pay | Admitting: Family Medicine

## 2017-04-09 DIAGNOSIS — E559 Vitamin D deficiency, unspecified: Secondary | ICD-10-CM

## 2017-04-13 ENCOUNTER — Ambulatory Visit (INDEPENDENT_AMBULATORY_CARE_PROVIDER_SITE_OTHER): Payer: 59 | Admitting: Family Medicine

## 2017-04-13 VITALS — BP 110/72 | HR 70 | Temp 98.2°F | Ht 63.0 in | Wt 187.0 lb

## 2017-04-13 DIAGNOSIS — E669 Obesity, unspecified: Secondary | ICD-10-CM

## 2017-04-13 DIAGNOSIS — R7303 Prediabetes: Secondary | ICD-10-CM

## 2017-04-13 DIAGNOSIS — Z9189 Other specified personal risk factors, not elsewhere classified: Secondary | ICD-10-CM | POA: Diagnosis not present

## 2017-04-13 DIAGNOSIS — E559 Vitamin D deficiency, unspecified: Secondary | ICD-10-CM

## 2017-04-13 DIAGNOSIS — Z6833 Body mass index (BMI) 33.0-33.9, adult: Secondary | ICD-10-CM

## 2017-04-13 DIAGNOSIS — I10 Essential (primary) hypertension: Secondary | ICD-10-CM

## 2017-04-13 MED ORDER — HYDROCHLOROTHIAZIDE 12.5 MG PO TABS: 12.5000 mg | ORAL_TABLET | Freq: Every day | ORAL | 0 refills | Status: DC

## 2017-04-13 MED ORDER — METFORMIN HCL 500 MG PO TABS: 500.0000 mg | ORAL_TABLET | Freq: Two times a day (BID) | ORAL | 0 refills | Status: DC

## 2017-04-13 MED ORDER — VITAMIN D (ERGOCALCIFEROL) 1.25 MG (50000 UNIT) PO CAPS: 50000.0000 [IU] | ORAL_CAPSULE | ORAL | 0 refills | Status: DC

## 2017-04-14 MED FILL — metFORMIN HCL 500 MG TABS: 500 | 30 days supply | Qty: 60 | Fill #0

## 2017-04-14 MED FILL — VIT D2 1.25 MG (50,000 UNIT: 1.25 MG | 28 days supply | Qty: 4 | Fill #0

## 2017-04-14 MED FILL — HYDROCHLOROTHIAZIDE 12.5 MG: 12.5 | 30 days supply | Qty: 30 | Fill #0

## 2017-04-14 NOTE — Progress Notes (Signed)
Office: [phone]  /  Fax: [phone]   HPI:   Chief Complaint: OBESITY Kristi Huynh is here to discuss her progress with her obesity treatment plan. She is on the  follow the Category 2 plan and is following her eating plan approximately 75 % of the time. She states she is working in yard and swimming 60 minutes 3 times per week. Kristi Huynh is off track more in the last 2 weeks. She is retaining some fluid but hasn't lost any further. Her weight is 187 lb (84.8 kg) today and has had a weight gain of 2 pounds over a period of 3 weeks since her last visit. She has lost 18 lbs since starting treatment with [name].  Vitamin D deficiency Kristi Huynh has a diagnosis of vitamin D deficiency. She is currently stable on vit D, not yet at goal and denies nausea, vomiting or muscle weakness.  Hypertension SHELIA Huynh is a 51 y.o. female with hypertension. Her blood pressure is stable on HCTZ. Kristi Huynh denies chest pain, headache or shortness of breath on exertion. She is working weight loss to help control her blood pressure with the goal of decreasing her risk of heart attack and stroke. Tishas blood pressure is currently controlled.  Pre-Diabetes Kristi Huynh has a diagnosis of pre-diabetes based on her elevated Hgb A1c and was informed this puts her at greater risk of developing diabetes. She is stable on metformin and continues to work on diet and exercise to decrease risk of diabetes. She denies nausea, vomiting or hypoglycemia. She is struggling with diet and increasing simple carbohydrates in the last 2 to 3 weeks.  At risk for diabetes Kristi Huynh is at higher than average risk for developing diabetes due to her obesity and pre-diabetes. She currently denies polyuria or polydipsia.  ALLERGIES: Allergies  Allergen Reactions  . Codeine   . Oxycodone Hcl   . Penicillins   . Sulfonamide Derivatives     MEDICATIONS: Current Outpatient Prescriptions on File Prior to Visit  Medication Sig Dispense Refill    . Ascorbic Acid (VITA-C PO) Take 2 tablets by mouth every morning.    . Loratadine-Pseudoephedrine (HCA ALLERGY/NASAL DECONGESTANT PO) Take 1 tablet by mouth every morning.    . Melatonin 10 MG TABS Take 1 tablet by mouth at bedtime.    . Misc Natural Products (ENERGY FOCUS) TABS Take 1 tablet by mouth every morning.    . Multiple Vitamin (MULTIVITAMIN) tablet Take 1 tablet by mouth every morning.    . Omega-3 Fatty Acids (FISH OIL) 1000 MG CAPS Take 1 capsule by mouth every morning.    . polyethylene glycol powder (GLYCOLAX/MIRALAX) powder Take 17 g by mouth every morning. 3350 g 0  . Probiotic Product (PROBIOTIC-10 PO) Take 1 tablet by mouth every morning.    [name] 150 MG TABS Take 4 tablets by mouth every morning.    . vitamin B-12 (CYANOCOBALAMIN) 1000 MCG tablet Take 1,000 mcg by mouth daily.    . Wheat Dextrin (BENEFIBER) POWD Take 1 scoop by mouth 2 (two) times daily. One TBSP BID  0   No current facility-administered medications on file prior to visit.     PAST MEDICAL HISTORY: Past Medical History:  Diagnosis Date  . Anxiety   . back pain   . Depression   . joint pain   . Swelling    feet and legs    PAST SURGICAL HISTORY: Past Surgical History:  Procedure Laterality Date  . ANKLE SURGERY  Right 2 times, once as teen and once at age 11  . CYST REMOVAL HAND     Right hand age 47 and 31  . LAPAROSCOPY     endometrosis  . TONSILLECTOMY AND ADENOIDECTOMY     51 years old    SOCIAL HISTORY: Social History  Substance Use Topics  . Smoking status: Never Smoker  . Smokeless tobacco: Never Used  . Alcohol use Not on file    FAMILY HISTORY: Family History  Problem Relation Age of Onset  . Diabetes Mother   . Hypertension Mother   . Hyperlipidemia Mother   . Thyroid disease Mother   . Depression Mother   . Anxiety disorder Mother   . Eating disorder Mother   . Obesity Mother   . Heart disease Father   . Sudden death Father     ROS: Review of  Systems  Constitutional: Negative for weight loss.  Respiratory: Negative for shortness of breath (on exertion).   Cardiovascular: Negative for chest pain.  Gastrointestinal: Negative for nausea and vomiting.  Genitourinary: Negative for frequency.  Musculoskeletal:       Negative muscle weakness  Neurological: Negative for headaches.  Endo/Heme/Allergies: Negative for polydipsia.       Negative hypoglycemia    PHYSICAL EXAM: Blood pressure 110/72, pulse 70, temperature 98.2 F (36.8 C), temperature source Oral, height 5\' 3"  (1.6 m), weight 187 lb (84.8 kg), SpO2 98 %. Body mass index is 33.13 kg/m. Physical Exam  Constitutional: She is oriented to person, place, and time. She appears well-developed and well-nourished.  Cardiovascular: Normal rate.   Pulmonary/Chest: Effort normal.  Musculoskeletal: Normal range of motion.  Neurological: She is oriented to person, place, and time.  Skin: Skin is warm and dry.  Psychiatric: She has a normal mood and affect. Her behavior is normal.  Vitals reviewed.   RECENT LABS AND TESTS: BMET    Component Value Date/Time   NA 140 03/11/2017 0816   K 3.8 03/11/2017 0816   CL 97 03/11/2017 0816   CO2 26 03/11/2017 0816   GLUCOSE 101 (H) 03/11/2017 0816   GLUCOSE 93 12/03/2009 2056   BUN 13 03/11/2017 0816   CREATININE 0.74 03/11/2017 0816   CALCIUM 9.5 03/11/2017 0816   GFRNONAA 93 03/11/2017 0816   GFRAA 108 03/11/2017 0816   Lab Results  Component Value Date   HGBA1C 5.6 03/11/2017   HGBA1C 5.4 10/09/2016   Lab Results  Component Value Date   INSULIN 21.3 03/11/2017   INSULIN 14.4 10/09/2016   CBC    Component Value Date/Time   WBC 8.8 10/09/2016 1442   WBC 6.2 10/24/2008 2117   RBC 4.84 10/09/2016 1442   RBC 4.64 10/24/2008 2117   HGB 14.4 10/09/2016 1442   HCT 42.4 10/09/2016 1442   PLT 252 10/24/2008 2117   MCV 88 10/09/2016 1442   MCH 29.8 10/09/2016 1442   MCHC 34.0 10/09/2016 1442   MCHC 32.6 10/24/2008 2117     RDW 13.7 10/09/2016 1442   LYMPHSABS 2.8 10/09/2016 1442   EOSABS 0.1 10/09/2016 1442   BASOSABS 0.1 10/09/2016 1442   Iron/TIBC/Ferritin/ %Sat No results found for: IRON, TIBC, FERRITIN, IRONPCTSAT Lipid Panel     Component Value Date/Time   CHOL 181 03/11/2017 0816   TRIG 224 (H) 03/11/2017 0816   HDL 37 (L) 03/11/2017 0816   CHOLHDL 4.7 Ratio 12/03/2009 2056   VLDL 27 12/03/2009 2056   LDLCALC 99 03/11/2017 0816   Hepatic Function Panel  Component Value Date/Time   PROT 7.6 03/11/2017 0816   ALBUMIN 4.5 03/11/2017 0816   AST 27 03/11/2017 0816   ALT 19 03/11/2017 0816   ALKPHOS 69 03/11/2017 0816   BILITOT 0.6 03/11/2017 0816      Component Value Date/Time   TSH 1.970 10/09/2016 1442   TSH 1.990 12/03/2009 2056   TSH 2.142 10/24/2008 2117    ASSESSMENT AND PLAN: Essential hypertension - Plan: hydrochlorothiazide (HYDRODIURIL) 12.5 MG tablet  Prediabetes - Plan: metFORMIN (GLUCOPHAGE) 500 MG tablet  Vitamin D deficiency - Plan: Vitamin D, Ergocalciferol, (DRISDOL) 50000 units CAPS capsule  At risk for diabetes mellitus  Class 1 obesity with serious comorbidity and body mass index (BMI) of 33.0 to 33.9 in adult, unspecified obesity type  PLAN:  Vitamin D Deficiency Nikyla was informed that low vitamin D levels contributes to fatigue and are associated with obesity, breast, and colon cancer. She agrees to continue to take prescription Vit D @50 ,000 IU every week, we will refill for 1 month and will follow up for routine testing of vitamin D, at least 2-3 times per year. She was informed of the risk of over-replacement of vitamin D and agrees to not increase her dose unless he discusses this with [name] first. Palmyra agrees to follow up with our clinic in 2 weeks.  Hypertension We discussed sodium restriction, working on healthy weight loss, and a regular exercise program as the means to achieve improved blood pressure control. Isabella agreed with this plan and agreed  to follow up as directed. We will continue to monitor her blood pressure as well as her progress with the above lifestyle modifications. She agrees to continue HCTZ, we will refill for 1 month and she will watch for signs of hypotension as she continues her lifestyle modifications.  Pre-Diabetes Aubriegh will continue to work on weight loss, exercise, and decreasing simple carbohydrates in her diet to help decrease the risk of diabetes. We dicussed metformin including benefits and risks. She was informed that eating too many simple carbohydrates or too many calories at one sitting increases the likelihood of GI side effects. Katriana requested metformin for now and a prescription was written today for 1 month refill. Tinley agreed to follow up with [name] as directed to monitor her progress.  Diabetes risk counselling Artia was given extended (at least 15 minutes) diabetes prevention counseling today. She is 51 y.o. female and has risk factors for diabetes including obesity and pre-diabetes. We discussed intensive lifestyle modifications today with an emphasis on weight loss as well as increasing exercise and decreasing simple carbohydrates in her diet.  Obesity Enora is currently in the action stage of change. As such, her goal is to continue with weight loss efforts She has agreed to change to follow the Pescatarian eating plan Jeremiah has been instructed to work up to a goal of 150 minutes of combined cardio and strengthening exercise per week for weight loss and overall health benefits. We discussed the following Behavioral Modification Strategies today: increase H2O intake, increasing lean protein intake, decreasing simple carbohydrates , decreasing sodium intake, decrease eating out and emotional eating strategies  Lajean has agreed to follow up with our clinic in 2 weeks. She was informed of the importance of frequent follow up visits to maximize her success with intensive lifestyle modifications for her  multiple health conditions.  I, Doreene Nest, am acting as transcriptionist for Dennard Nip, MD  I have reviewed the above documentation for accuracy and completeness, and I agree  with the above. -Dennard Nip, MD  OBESITY BEHAVIORAL INTERVENTION VISIT  Today's visit was # 12 out of 22.  Starting weight: 205 lbs Starting date: 10/09/17 Today's weight : 187 lbs Today's date: 04/13/2017 Total lbs lost to date: 10 (Patients must lose 7 lbs in the first 6 months to continue with counseling)   ASK: We discussed the diagnosis of obesity with Kristi Huynh today and Sekai agreed to give [name] permission to discuss obesity behavioral modification therapy today.  ASSESS: Lorane has the diagnosis of obesity and her BMI today is 33.2 Dillyn is in the action stage of change   ADVISE: Tamina was educated on the multiple health risks of obesity as well as the benefit of weight loss to improve her health. She was advised of the need for long term treatment and the importance of lifestyle modifications.  AGREE: Multiple dietary modification options and treatment options were discussed and  Melda agreed to change to follow the Pescatarian eating plan We discussed the following Behavioral Modification Strategies today: increase H2O intake, increasing lean protein intake, decreasing simple carbohydrates , decreasing sodium intake, decrease eating out and emotional eating strategies

## 2017-04-28 ENCOUNTER — Ambulatory Visit (INDEPENDENT_AMBULATORY_CARE_PROVIDER_SITE_OTHER): Payer: 59 | Admitting: Family Medicine

## 2017-04-28 VITALS — BP 122/75 | HR 73 | Temp 97.9°F | Ht 63.0 in | Wt 185.0 lb

## 2017-04-28 DIAGNOSIS — Z9189 Other specified personal risk factors, not elsewhere classified: Secondary | ICD-10-CM

## 2017-04-28 DIAGNOSIS — E669 Obesity, unspecified: Secondary | ICD-10-CM

## 2017-04-28 DIAGNOSIS — Z6832 Body mass index (BMI) 32.0-32.9, adult: Secondary | ICD-10-CM

## 2017-04-28 DIAGNOSIS — R7303 Prediabetes: Secondary | ICD-10-CM | POA: Diagnosis not present

## 2017-04-28 NOTE — Progress Notes (Signed)
Office: [phone]  /  Fax: [phone]   HPI:   Chief Complaint: OBESITY Kristi Huynh is here to discuss her progress with her obesity treatment plan. She is on the  follow the Pescatarian eating plan and is following her eating plan approximately 70 % of the time. She states she is exercising 0 minutes 0 times per week. Kristi Huynh is doing well with weight loss and is back down to her lowest weight with [name]. She is not following a plan closely, mostly portion control/smart choices. Protein is not at goal. Her weight is 185 lb (83.9 kg) today and has had a weight loss of 2 pounds over a period of 2 weeks since her last visit. She has lost 20 lbs since starting treatment with [name].  Pre-Diabetes Kristi Huynh has a diagnosis of pre-diabetes based on her elevated Hgb A1c and was informed this puts her at greater risk of developing diabetes. She is not taking metformin currently. Kristi Huynh is attempting to control her pre-diabetes with diet and decreased simple carbohydrates to decrease risk of diabetes. She denies nausea or hypoglycemia.  At risk for diabetes Kristi Huynh is at higher than average risk for developing diabetes due to her obesity and pre-diabetes. She currently denies polyuria or polydipsia.   ALLERGIES: Allergies  Allergen Reactions  . Codeine   . Oxycodone Hcl   . Penicillins   . Sulfonamide Derivatives     MEDICATIONS: Current Outpatient Prescriptions on File Prior to Visit  Medication Sig Dispense Refill  . Ascorbic Acid (VITA-C PO) Take 2 tablets by mouth every morning.    . hydrochlorothiazide (HYDRODIURIL) 12.5 MG tablet Take 1 tablet (12.5 mg total) by mouth daily. 30 tablet 0  . Loratadine-Pseudoephedrine (HCA ALLERGY/NASAL DECONGESTANT PO) Take 1 tablet by mouth every morning.    . Melatonin 10 MG TABS Take 1 tablet by mouth at bedtime.    . metFORMIN (GLUCOPHAGE) 500 MG tablet Take 1 tablet (500 mg total) by mouth 2 (two) times daily with a meal. 60 tablet 0  . Misc Natural Products  (ENERGY FOCUS) TABS Take 1 tablet by mouth every morning.    . Multiple Vitamin (MULTIVITAMIN) tablet Take 1 tablet by mouth every morning.    . Omega-3 Fatty Acids (FISH OIL) 1000 MG CAPS Take 1 capsule by mouth every morning.    . polyethylene glycol powder (GLYCOLAX/MIRALAX) powder Take 17 g by mouth every morning. 3350 g 0  . Probiotic Product (PROBIOTIC-10 PO) Take 1 tablet by mouth every morning.    Kristi Huynh [name] 150 MG TABS Take 4 tablets by mouth every morning.    . vitamin B-12 (CYANOCOBALAMIN) 1000 MCG tablet Take 1,000 mcg by mouth daily.    . Vitamin D, Ergocalciferol, (DRISDOL) 50000 units CAPS capsule Take 1 capsule (50,000 Units total) by mouth every 7 (seven) days. 4 capsule 0  . Wheat Dextrin (BENEFIBER) POWD Take 1 scoop by mouth 2 (two) times daily. One TBSP BID  0   No current facility-administered medications on file prior to visit.     PAST MEDICAL HISTORY: Past Medical History:  Diagnosis Date  . Anxiety   . back pain   . Depression   . joint pain   . Swelling    feet and legs    PAST SURGICAL HISTORY: Past Surgical History:  Procedure Laterality Date  . ANKLE SURGERY     Right 2 times, once as teen and once at age 60  . CYST REMOVAL HAND     Right  hand age 13 and 32  . LAPAROSCOPY     endometrosis  . TONSILLECTOMY AND ADENOIDECTOMY     51 years old    SOCIAL HISTORY: Social History  Substance Use Topics  . Smoking status: Never Smoker  . Smokeless tobacco: Never Used  . Alcohol use Not on file    FAMILY HISTORY: Family History  Problem Relation Age of Onset  . Diabetes Mother   . Hypertension Mother   . Hyperlipidemia Mother   . Thyroid disease Mother   . Depression Mother   . Anxiety disorder Mother   . Eating disorder Mother   . Obesity Mother   . Heart disease Father   . Sudden death Father     ROS: Review of Systems  Constitutional: Positive for weight loss.  Gastrointestinal: Negative for nausea.  Genitourinary: Negative  for frequency.  Endo/Heme/Allergies: Negative for polydipsia.       Negative hypoglycemia    PHYSICAL EXAM: Blood pressure 122/75, pulse 73, temperature 97.9 F (36.6 C), temperature source Oral, height 5\' 3"  (1.6 m), weight 185 lb (83.9 kg), SpO2 98 %. Body mass index is 32.77 kg/m. Physical Exam  Constitutional: She is oriented to person, place, and time. She appears well-developed and well-nourished.  Cardiovascular: Normal rate.   Pulmonary/Chest: Effort normal.  Musculoskeletal: Normal range of motion.  Neurological: She is oriented to person, place, and time.  Skin: Skin is warm and dry.  Psychiatric: She has a normal mood and affect. Her behavior is normal.  Vitals reviewed.   RECENT LABS AND TESTS: BMET    Component Value Date/Time   NA 140 03/11/2017 0816   K 3.8 03/11/2017 0816   CL 97 03/11/2017 0816   CO2 26 03/11/2017 0816   GLUCOSE 101 (H) 03/11/2017 0816   GLUCOSE 93 12/03/2009 2056   BUN 13 03/11/2017 0816   CREATININE 0.74 03/11/2017 0816   CALCIUM 9.5 03/11/2017 0816   GFRNONAA 93 03/11/2017 0816   GFRAA 108 03/11/2017 0816   Lab Results  Component Value Date   HGBA1C 5.6 03/11/2017   HGBA1C 5.4 10/09/2016   Lab Results  Component Value Date   INSULIN 21.3 03/11/2017   INSULIN 14.4 10/09/2016   CBC    Component Value Date/Time   WBC 8.8 10/09/2016 1442   WBC 6.2 10/24/2008 2117   RBC 4.84 10/09/2016 1442   RBC 4.64 10/24/2008 2117   HGB 14.4 10/09/2016 1442   HCT 42.4 10/09/2016 1442   PLT 252 10/24/2008 2117   MCV 88 10/09/2016 1442   MCH 29.8 10/09/2016 1442   MCHC 34.0 10/09/2016 1442   MCHC 32.6 10/24/2008 2117   RDW 13.7 10/09/2016 1442   LYMPHSABS 2.8 10/09/2016 1442   EOSABS 0.1 10/09/2016 1442   BASOSABS 0.1 10/09/2016 1442   Iron/TIBC/Ferritin/ %Sat No results found for: IRON, TIBC, FERRITIN, IRONPCTSAT Lipid Panel     Component Value Date/Time   CHOL 181 03/11/2017 0816   TRIG 224 (H) 03/11/2017 0816   HDL 37 (L)  03/11/2017 0816   CHOLHDL 4.7 Ratio 12/03/2009 2056   VLDL 27 12/03/2009 2056   LDLCALC 99 03/11/2017 0816   Hepatic Function Panel     Component Value Date/Time   PROT 7.6 03/11/2017 0816   ALBUMIN 4.5 03/11/2017 0816   AST 27 03/11/2017 0816   ALT 19 03/11/2017 0816   ALKPHOS 69 03/11/2017 0816   BILITOT 0.6 03/11/2017 0816      Component Value Date/Time   TSH 1.970 10/09/2016 1442  TSH 1.990 12/03/2009 2056   TSH 2.142 10/24/2008 2117    ASSESSMENT AND PLAN: Prediabetes  At risk for diabetes mellitus  Class 1 obesity with serious comorbidity and body mass index (BMI) of 32.0 to 32.9 in adult, unspecified obesity type  PLAN:  Pre-Diabetes Sahiti will continue to work on weight loss, exercise, and decreasing simple carbohydrates in her diet to help decrease the risk of diabetes. She was informed that eating too many simple carbohydrates or too many calories at one sitting increases the likelihood of GI side effects. We will re-check labs in 6 weeks and Bridey agreed to follow up with [name] as directed to monitor her progress.  Diabetes risk counselling Mackenze was given extended (15 minutes) diabetes prevention counseling today. She is 51 y.o. female and has risk factors for diabetes including obesity and pre-diabetes. We discussed intensive lifestyle modifications today with an emphasis on weight loss as well as increasing exercise and decreasing simple carbohydrates in her diet.  We spent > than 50% of the 15 minute visit on the counseling as documented in the note.  Obesity Roby is currently in the action stage of change. As such, her goal is to continue with weight loss efforts She has agreed to follow the Pescatarian eating plan Anis has been instructed to work up to a goal of 150 minutes of combined cardio and strengthening exercise per week for weight loss and overall health benefits. We discussed the following Behavioral Modification Strategies today: no skipping  meals and better snacking choices  Nichele has agreed to follow up with our clinic in 2 to 3 weeks. She was informed of the importance of frequent follow up visits to maximize her success with intensive lifestyle modifications for her multiple health conditions.  I, Doreene Nest, am acting as transcriptionist for Dennard Nip, MD  I have reviewed the above documentation for accuracy and completeness, and I agree with the above. -Dennard Nip, MD   OBESITY BEHAVIORAL INTERVENTION VISIT  Today's visit was # 13 out of 22.  Starting weight: 205 lbs Starting date: 10/09/16 Today's weight : 185 lbs Today's date: 04/28/2017 Total lbs lost to date: 20 (Patients must lose 7 lbs in the first 6 months to continue with counseling)   ASK: We discussed the diagnosis of obesity with [name] today and Kalla agreed to give [name] permission to discuss obesity behavioral modification therapy today.  ASSESS: Nafeesah has the diagnosis of obesity and her BMI today is 32.8 Raigan is in the action stage of change   ADVISE: Nonna was educated on the multiple health risks of obesity as well as the benefit of weight loss to improve her health. She was advised of the need for long term treatment and the importance of lifestyle modifications.  AGREE: Multiple dietary modification options and treatment options were discussed and  Sherrian agreed to follow the Pescatarian eating plan We discussed the following Behavioral Modification Strategies today: no skipping meals and better snacking choices

## 2017-05-19 ENCOUNTER — Ambulatory Visit (INDEPENDENT_AMBULATORY_CARE_PROVIDER_SITE_OTHER): Payer: 59 | Admitting: Family Medicine

## 2017-05-19 VITALS — BP 125/68 | HR 81 | Temp 98.7°F | Ht 63.0 in | Wt 184.0 lb

## 2017-05-19 DIAGNOSIS — Z6832 Body mass index (BMI) 32.0-32.9, adult: Secondary | ICD-10-CM | POA: Diagnosis not present

## 2017-05-19 DIAGNOSIS — I10 Essential (primary) hypertension: Secondary | ICD-10-CM | POA: Diagnosis not present

## 2017-05-19 DIAGNOSIS — E559 Vitamin D deficiency, unspecified: Secondary | ICD-10-CM

## 2017-05-19 DIAGNOSIS — R7303 Prediabetes: Secondary | ICD-10-CM

## 2017-05-19 DIAGNOSIS — Z9189 Other specified personal risk factors, not elsewhere classified: Secondary | ICD-10-CM

## 2017-05-19 DIAGNOSIS — E669 Obesity, unspecified: Secondary | ICD-10-CM

## 2017-05-19 MED ORDER — HYDROCHLOROTHIAZIDE 12.5 MG PO TABS: 12.5000 mg | ORAL_TABLET | Freq: Every day | ORAL | 0 refills | Status: DC

## 2017-05-19 MED ORDER — VITAMIN D (ERGOCALCIFEROL) 1.25 MG (50000 UNIT) PO CAPS
50000.0000 [IU] | ORAL_CAPSULE | ORAL | 0 refills | Status: DC
Start: 2017-05-19 — End: 2017-06-09

## 2017-05-19 MED ORDER — METFORMIN HCL 500 MG PO TABS: 500.0000 mg | ORAL_TABLET | Freq: Two times a day (BID) | ORAL | 0 refills | Status: DC

## 2017-05-19 MED FILL — metFORMIN HCL 500 MG TABS: 500 | 30 days supply | Qty: 60 | Fill #0

## 2017-05-19 MED FILL — VIT D2 1.25 MG (50,000 UNIT: 1.25 MG | 28 days supply | Qty: 4 | Fill #0

## 2017-05-19 MED FILL — HYDROCHLOROTHIAZIDE 12.5 MG: 12.5 | 30 days supply | Qty: 30 | Fill #0

## 2017-05-20 NOTE — Progress Notes (Signed)
Office: [phone]  /  Fax: [phone]   HPI:   Chief Complaint: OBESITY Kristi Huynh is here to discuss her progress with her obesity treatment plan. She is on the Pescatarian eating plan and is following her eating plan approximately 80 % of the time. She states she is walking for 60 minutes 2 times per week. Kristi Huynh continues to lose weight and has been walking for exercise. She has been deviating from her plan more in the last 2 weeks with decreased weight loss but is still going in the right direction. Kristi Huynh is struggling with planning meals ahead of time. Her weight is 184 lb (83.5 kg) today and has had a weight loss of 1 pound over a period of 3 weeks since her last visit. She has lost 21 lbs since starting treatment with [name].  Vitamin D deficiency Kristi Huynh has a diagnosis of vitamin D deficiency. She is currently stable on vit D and denies nausea, vomiting or muscle weakness.  Hypertension Kristi Huynh is a 51 y.o. female with hypertension.  Kristi Huynh denies chest pain or shortness of breath on exertion. She is working weight loss to help control her blood pressure with the goal of decreasing her risk of heart attack and stroke. Kristi Huynh blood pressure is currently controlled.  Pre-Diabetes Kristi Huynh has a diagnosis of pre-diabetes based on her elevated Hgb A1c and was informed this puts her at greater risk of developing diabetes. She is stable on metformin currently and continues to work on diet and exercise to decrease risk of diabetes. She denies nausea or hypoglycemia.  At risk for diabetes Kristi Huynh is at higher than average risk for developing diabetes due to her obesity and pre-diabetes. She currently denies polyuria or polydipsia.  ALLERGIES: Allergies  Allergen Reactions  . Codeine   . Oxycodone Hcl   . Penicillins   . Sulfonamide Derivatives     MEDICATIONS: Current Outpatient Prescriptions on File Prior to Visit  Medication Sig Dispense Refill  . Ascorbic Acid (VITA-C  PO) Take 2 tablets by mouth every morning.    . Loratadine-Pseudoephedrine (HCA ALLERGY/NASAL DECONGESTANT PO) Take 1 tablet by mouth every morning.    . Melatonin 10 MG TABS Take 1 tablet by mouth at bedtime.    . Misc Natural Products (ENERGY FOCUS) TABS Take 1 tablet by mouth every morning.    . Multiple Vitamin (MULTIVITAMIN) tablet Take 1 tablet by mouth every morning.    . Omega-3 Fatty Acids (FISH OIL) 1000 MG CAPS Take 1 capsule by mouth every morning.    . polyethylene glycol powder (GLYCOLAX/MIRALAX) powder Take 17 g by mouth every morning. 3350 g 0  . Probiotic Product (PROBIOTIC-10 PO) Take 1 tablet by mouth every morning.    [name] 150 MG TABS Take 4 tablets by mouth every morning.    . vitamin B-12 (CYANOCOBALAMIN) 1000 MCG tablet Take 1,000 mcg by mouth daily.    . Wheat Dextrin (BENEFIBER) POWD Take 1 scoop by mouth 2 (two) times daily. One TBSP BID  0   No current facility-administered medications on file prior to visit.     PAST MEDICAL HISTORY: Past Medical History:  Diagnosis Date  . Anxiety   . back pain   . Depression   . joint pain   . Swelling    feet and legs    PAST SURGICAL HISTORY: Past Surgical History:  Procedure Laterality Date  . ANKLE SURGERY     Right 2 times, once as teen  and once at age 69  . CYST REMOVAL HAND     Right hand age 39 and 73  . LAPAROSCOPY     endometrosis  . TONSILLECTOMY AND ADENOIDECTOMY     51 years old    SOCIAL HISTORY: Social History  Substance Use Topics  . Smoking status: Never Smoker  . Smokeless tobacco: Never Used  . Alcohol use Not on file    FAMILY HISTORY: Family History  Problem Relation Age of Onset  . Diabetes Mother   . Hypertension Mother   . Hyperlipidemia Mother   . Thyroid disease Mother   . Depression Mother   . Anxiety disorder Mother   . Eating disorder Mother   . Obesity Mother   . Heart disease Father   . Sudden death Father     ROS: Review of Systems  Constitutional:  Positive for weight loss.  Respiratory: Negative for shortness of breath (on exertion).   Cardiovascular: Negative for chest pain.  Gastrointestinal: Negative for nausea and vomiting.  Genitourinary: Negative for frequency.  Musculoskeletal:       Negative muscle weakness  Endo/Heme/Allergies: Negative for polydipsia.       Negative hypoglycemia    PHYSICAL EXAM: Blood pressure 125/68, pulse 81, temperature 98.7 F (37.1 C), temperature source Oral, height 5\' 3"  (1.6 m), weight 184 lb (83.5 kg), SpO2 99 %. Body mass index is 32.59 kg/m. Physical Exam  Constitutional: She is oriented to person, place, and time. She appears well-developed and well-nourished.  Cardiovascular: Normal rate.   Pulmonary/Chest: Effort normal.  Musculoskeletal: Normal range of motion.  Neurological: She is oriented to person, place, and time.  Skin: Skin is warm and dry.  Psychiatric: She has a normal mood and affect. Her behavior is normal.  Vitals reviewed.   RECENT LABS AND TESTS: BMET    Component Value Date/Time   NA 140 03/11/2017 0816   K 3.8 03/11/2017 0816   CL 97 03/11/2017 0816   CO2 26 03/11/2017 0816   GLUCOSE 101 (H) 03/11/2017 0816   GLUCOSE 93 12/03/2009 2056   BUN 13 03/11/2017 0816   CREATININE 0.74 03/11/2017 0816   CALCIUM 9.5 03/11/2017 0816   GFRNONAA 93 03/11/2017 0816   GFRAA 108 03/11/2017 0816   Lab Results  Component Value Date   HGBA1C 5.6 03/11/2017   HGBA1C 5.4 10/09/2016   Lab Results  Component Value Date   INSULIN 21.3 03/11/2017   INSULIN 14.4 10/09/2016   CBC    Component Value Date/Time   WBC 8.8 10/09/2016 1442   WBC 6.2 10/24/2008 2117   RBC 4.84 10/09/2016 1442   RBC 4.64 10/24/2008 2117   HGB 14.4 10/09/2016 1442   HCT 42.4 10/09/2016 1442   PLT 252 10/24/2008 2117   MCV 88 10/09/2016 1442   MCH 29.8 10/09/2016 1442   MCHC 34.0 10/09/2016 1442   MCHC 32.6 10/24/2008 2117   RDW 13.7 10/09/2016 1442   LYMPHSABS 2.8 10/09/2016 1442    EOSABS 0.1 10/09/2016 1442   BASOSABS 0.1 10/09/2016 1442   Iron/TIBC/Ferritin/ %Sat No results found for: IRON, TIBC, FERRITIN, IRONPCTSAT Lipid Panel     Component Value Date/Time   CHOL 181 03/11/2017 0816   TRIG 224 (H) 03/11/2017 0816   HDL 37 (L) 03/11/2017 0816   CHOLHDL 4.7 Ratio 12/03/2009 2056   VLDL 27 12/03/2009 2056   LDLCALC 99 03/11/2017 0816   Hepatic Function Panel     Component Value Date/Time   PROT 7.6 03/11/2017 0816  ALBUMIN 4.5 03/11/2017 0816   AST 27 03/11/2017 0816   ALT 19 03/11/2017 0816   ALKPHOS 69 03/11/2017 0816   BILITOT 0.6 03/11/2017 0816      Component Value Date/Time   TSH 1.970 10/09/2016 1442   TSH 1.990 12/03/2009 2056   TSH 2.142 10/24/2008 2117    ASSESSMENT AND PLAN: Vitamin D deficiency - Plan: Vitamin D, Ergocalciferol, (DRISDOL) 50000 units CAPS capsule  Prediabetes - Plan: metFORMIN (GLUCOPHAGE) 500 MG tablet  Essential hypertension - Plan: hydrochlorothiazide (HYDRODIURIL) 12.5 MG tablet  At risk for diabetes mellitus  Class 1 obesity with serious comorbidity and body mass index (BMI) of 32.0 to 32.9 in adult, unspecified obesity type  PLAN:  Vitamin D Deficiency Kristi Huynh was informed that low vitamin D levels contributes to fatigue and are associated with obesity, breast, and colon cancer. She agrees to continue to take prescription Vit D @50 ,000 IU every week, we will refill for 1 month and will follow up for routine testing of vitamin D, at least 2-3 times per year. She was informed of the risk of over-replacement of vitamin D and agrees to not increase her dose unless he discusses this with [name] first. Kristi Huynh agrees to follow up with our clinic in 3 weeks.  Hypertension We discussed sodium restriction, working on healthy weight loss, and a regular exercise program as the means to achieve improved blood pressure control. Kristi Huynh agreed with this plan and agreed to follow up as directed. We will continue to monitor her blood  pressure as well as her progress with the above lifestyle modifications. She agrees to continue HCTZ, we will refill for 1 month and she will watch for signs of hypotension as she continues her lifestyle modifications.  Pre-Diabetes Kristi Huynh will continue to work on weight loss, exercise, and decreasing simple carbohydrates in her diet to help decrease the risk of diabetes. We dicussed metformin including benefits and risks. She was informed that eating too many simple carbohydrates or too many calories at one sitting increases the likelihood of GI side effects. Kristi Huynh agrees to continue metformin for now and a prescription was written today for 1 month refill. Kristi Huynh agreed to follow up with [name] as directed to monitor her progress.  Diabetes risk counselling Kristi Huynh was given extended (15 minutes) diabetes prevention counseling today. She is 51 y.o. female and has risk factors for diabetes including obesity. We discussed intensive lifestyle modifications today with an emphasis on weight loss as well as increasing exercise and decreasing simple carbohydrates in her diet.  Obesity Kristi Huynh is currently in the action stage of change. As such, her goal is to continue with weight loss efforts She has agreed to follow the Pescatarian eating plan Kristi Huynh has been instructed to work up to a goal of 150 minutes of combined cardio and strengthening exercise per week for weight loss and overall health benefits. We discussed the following Behavioral Modification Strategies today: meal planning & cooking strategies, increasing lean protein intake and decreasing simple carbohydrates   Kristi Huynh has agreed to follow up with our clinic in 3 weeks. She was informed of the importance of frequent follow up visits to maximize her success with intensive lifestyle modifications for her multiple health conditions.  I, Kristi Huynh, am acting as transcriptionist for Dennard Nip, MD  I have reviewed the above documentation for  accuracy and completeness, and I agree with the above. -Dennard Nip, MD   OBESITY BEHAVIORAL INTERVENTION VISIT  Today's visit was # 14 out of 22.  Starting weight: 205 lbs Starting date: 10/09/16 Today's weight : 184 lbs Today's date: 05/19/2017 Total lbs lost to date: 21 (Patients must lose 7 lbs in the first 6 months to continue with counseling)   ASK: We discussed the diagnosis of obesity with Kristi Huynh today and Kristi Huynh agreed to give [name] permission to discuss obesity behavioral modification therapy today.  ASSESS: Kristi Huynh has the diagnosis of obesity and her BMI today is 32.7 See is in the action stage of change   ADVISE: Kristi Huynh was educated on the multiple health risks of obesity as well as the benefit of weight loss to improve her health. She was advised of the need for long term treatment and the importance of lifestyle modifications.  AGREE: Multiple dietary modification options and treatment options were discussed and  Kristi Huynh agreed to follow the Pescatarian eating plan We discussed the following Behavioral Modification Strategies today: meal planning & cooking strategies, increasing lean protein intake and decreasing simple carbohydrates

## 2017-06-09 ENCOUNTER — Ambulatory Visit (INDEPENDENT_AMBULATORY_CARE_PROVIDER_SITE_OTHER): Payer: 59 | Admitting: Family Medicine

## 2017-06-09 VITALS — BP 122/81 | HR 82 | Temp 98.5°F | Ht 63.0 in | Wt 185.0 lb

## 2017-06-09 DIAGNOSIS — R7303 Prediabetes: Secondary | ICD-10-CM | POA: Diagnosis not present

## 2017-06-09 DIAGNOSIS — E669 Obesity, unspecified: Secondary | ICD-10-CM | POA: Diagnosis not present

## 2017-06-09 DIAGNOSIS — Z9189 Other specified personal risk factors, not elsewhere classified: Secondary | ICD-10-CM

## 2017-06-09 DIAGNOSIS — Z6832 Body mass index (BMI) 32.0-32.9, adult: Secondary | ICD-10-CM | POA: Diagnosis not present

## 2017-06-09 DIAGNOSIS — E559 Vitamin D deficiency, unspecified: Secondary | ICD-10-CM | POA: Diagnosis not present

## 2017-06-09 MED ORDER — VITAMIN D (ERGOCALCIFEROL) 1.25 MG (50000 UNIT) PO CAPS: 50000.0000 [IU] | ORAL_CAPSULE | ORAL | 0 refills | Status: DC

## 2017-06-09 MED ORDER — METFORMIN HCL 500 MG PO TABS: 500.0000 mg | ORAL_TABLET | Freq: Two times a day (BID) | ORAL | 0 refills | Status: DC

## 2017-06-09 NOTE — Progress Notes (Signed)
Office: [phone]  /  Fax: [phone]   HPI:   Chief Complaint: OBESITY Kristi Huynh is here to discuss her progress with her obesity treatment plan. She is on the  follow the Pescatarian eating plan and is following her eating plan approximately 75 % of the time. She states she is walking for 30 minutes 2 times per week. Kristi Huynh has had increased cravings with increased stress at work and at home. She is getting ready to travel on vacation and is worried about weight gain. Her weight is 185 lb (83.9 kg) today and has had a weight gain of 1 pound over a period of 3 weeks since her last visit. She has lost 20 lbs since starting treatment with [name].  Vitamin D deficiency Kristi Huynh has a diagnosis of vitamin D deficiency. She is currently stable on vit D but not yet at goal and she denies nausea, vomiting or muscle weakness.  Pre-Diabetes Kristi Huynh has a diagnosis of pre-diabetes based on her elevated Hgb A1c and was informed this puts her at greater risk of developing diabetes. She struggles with simple carbohydrates but is still eating better than she used to. She is taking metformin currently and continues to work on diet and exercise to decrease risk of diabetes. Polyphagia is improved on metformin and she denies nausea or hypoglycemia.  At risk for cardiovascular disease Kristi Huynh is at a higher than average risk for cardiovascular disease due to obesity and pre-diabetes. She currently denies any chest pain.  ALLERGIES: Allergies  Allergen Reactions   Codeine    Oxycodone Hcl    Penicillins    Sulfonamide Derivatives     MEDICATIONS: Current Outpatient Prescriptions on File Prior to Visit  Medication Sig Dispense Refill   Ascorbic Acid (VITA-C PO) Take 2 tablets by mouth every morning.     hydrochlorothiazide (HYDRODIURIL) 12.5 MG tablet Take 1 tablet (12.5 mg total) by mouth daily. 30 tablet 0   Loratadine-Pseudoephedrine (HCA ALLERGY/NASAL DECONGESTANT PO) Take 1 tablet by mouth every  morning.     Melatonin 10 MG TABS Take 1 tablet by mouth at bedtime.     Misc Natural Products (ENERGY FOCUS) TABS Take 1 tablet by mouth every morning.     Multiple Vitamin (MULTIVITAMIN) tablet Take 1 tablet by mouth every morning.     Omega-3 Fatty Acids (FISH OIL) 1000 MG CAPS Take 1 capsule by mouth every morning.     polyethylene glycol powder (GLYCOLAX/MIRALAX) powder Take 17 g by mouth every morning. 3350 g 0   Probiotic Product (PROBIOTIC-10 PO) Take 1 tablet by mouth every morning.     St [name] 150 MG TABS Take 4 tablets by mouth every morning.     vitamin B-12 (CYANOCOBALAMIN) 1000 MCG tablet Take 1,000 mcg by mouth daily.     Wheat Dextrin (BENEFIBER) POWD Take 1 scoop by mouth 2 (two) times daily. One TBSP BID  0   No current facility-administered medications on file prior to visit.     PAST MEDICAL HISTORY: Past Medical History:  Diagnosis Date   Anxiety    back pain    Depression    joint pain    Swelling    feet and legs    PAST SURGICAL HISTORY: Past Surgical History:  Procedure Laterality Date   ANKLE SURGERY     Right 2 times, once as teen and once at age 44   CYST REMOVAL HAND     Right hand age 14 and 82   LAPAROSCOPY  endometrosis   TONSILLECTOMY AND ADENOIDECTOMY     51 years old    SOCIAL HISTORY: Social History  Substance Use Topics   Smoking status: Never Smoker   Smokeless tobacco: Never Used   Alcohol use Not on file    FAMILY HISTORY: Family History  Problem Relation Age of Onset   Diabetes Mother    Hypertension Mother    Hyperlipidemia Mother    Thyroid disease Mother    Depression Mother    Anxiety disorder Mother    Eating disorder Mother    Obesity Mother    Heart disease Father    Sudden death Father     ROS: Review of Systems  Constitutional: Negative for weight loss.  Cardiovascular: Negative for chest pain.  Gastrointestinal: Negative for nausea and vomiting.    Musculoskeletal:       Negative muscle weakness  Endo/Heme/Allergies:       Polyphagia Negative hypoglycemia    PHYSICAL EXAM: Blood pressure 122/81, pulse 82, temperature 98.5 F (36.9 C), temperature source Oral, height 5\' 3"  (1.6 m), weight 185 lb (83.9 kg), SpO2 98 %. Body mass index is 32.77 kg/m. Physical Exam  Constitutional: She is oriented to person, place, and time. She appears well-developed and well-nourished.  Cardiovascular: Normal rate.   Pulmonary/Chest: Effort normal.  Musculoskeletal: Normal range of motion.  Neurological: She is oriented to person, place, and time.  Skin: Skin is warm and dry.  Psychiatric: She has a normal mood and affect. Her behavior is normal.  Vitals reviewed.   RECENT LABS AND TESTS: BMET    Component Value Date/Time   NA 140 03/11/2017 0816   K 3.8 03/11/2017 0816   CL 97 03/11/2017 0816   CO2 26 03/11/2017 0816   GLUCOSE 101 (H) 03/11/2017 0816   GLUCOSE 93 12/03/2009 2056   BUN 13 03/11/2017 0816   CREATININE 0.74 03/11/2017 0816   CALCIUM 9.5 03/11/2017 0816   GFRNONAA 93 03/11/2017 0816   GFRAA 108 03/11/2017 0816   Lab Results  Component Value Date   HGBA1C 5.6 03/11/2017   HGBA1C 5.4 10/09/2016   Lab Results  Component Value Date   INSULIN 21.3 03/11/2017   INSULIN 14.4 10/09/2016   CBC    Component Value Date/Time   WBC 8.8 10/09/2016 1442   WBC 6.2 10/24/2008 2117   RBC 4.84 10/09/2016 1442   RBC 4.64 10/24/2008 2117   HGB 14.4 10/09/2016 1442   HCT 42.4 10/09/2016 1442   PLT 252 10/24/2008 2117   MCV 88 10/09/2016 1442   MCH 29.8 10/09/2016 1442   MCHC 34.0 10/09/2016 1442   MCHC 32.6 10/24/2008 2117   RDW 13.7 10/09/2016 1442   LYMPHSABS 2.8 10/09/2016 1442   EOSABS 0.1 10/09/2016 1442   BASOSABS 0.1 10/09/2016 1442   Iron/TIBC/Ferritin/ %Sat No results found for: IRON, TIBC, FERRITIN, IRONPCTSAT Lipid Panel     Component Value Date/Time   CHOL 181 03/11/2017 0816   TRIG 224 (H) 03/11/2017  0816   HDL 37 (L) 03/11/2017 0816   CHOLHDL 4.7 Ratio 12/03/2009 2056   VLDL 27 12/03/2009 2056   LDLCALC 99 03/11/2017 0816   Hepatic Function Panel     Component Value Date/Time   PROT 7.6 03/11/2017 0816   ALBUMIN 4.5 03/11/2017 0816   AST 27 03/11/2017 0816   ALT 19 03/11/2017 0816   ALKPHOS 69 03/11/2017 0816   BILITOT 0.6 03/11/2017 0816      Component Value Date/Time   TSH 1.970 10/09/2016  1442   TSH 1.990 12/03/2009 2056   TSH 2.142 10/24/2008 2117    ASSESSMENT AND PLAN: Vitamin D deficiency - Plan: Vitamin D, Ergocalciferol, (DRISDOL) 50000 units CAPS capsule  Prediabetes - Plan: metFORMIN (GLUCOPHAGE) 500 MG tablet  At risk for heart disease  Class 1 obesity with serious comorbidity and body mass index (BMI) of 32.0 to 32.9 in adult, unspecified obesity type  PLAN:  Vitamin D Deficiency Kristi Huynh was informed that low vitamin D levels contributes to fatigue and are associated with obesity, breast, and colon cancer. She agrees to continue to take prescription Vit D @50 ,000 IU every week, we will refill for 1 month and will follow up for routine testing of vitamin D, at least 2-3 times per year. She was informed of the risk of over-replacement of vitamin D and agrees to not increase her dose unless he discusses this with [name] first. Kristi Huynh agrees to follow up with our clinic in 3 weeks.  Pre-Diabetes Kristi Huynh will continue to work on weight loss, exercise, and decreasing simple carbohydrates in her diet to help decrease the risk of diabetes. We dicussed metformin including benefits and risks. She was informed that eating too many simple carbohydrates or too many calories at one sitting increases the likelihood of GI side effects. Kristi Huynh agrees to continue metformin for now and a prescription was written today for 1 month refill. Kristi Huynh agreed to follow up with [name] as directed to monitor her progress.  Cardiovascular risk counselling Kristi Huynh was given extended (15 minutes) coronary  artery disease prevention counseling today. She is 51 y.o. female and has risk factors for heart disease including obesity and pre-diabetes. We discussed intensive lifestyle modifications today with an emphasis on specific weight loss instructions and strategies. Pt was also informed of the importance of increasing exercise and decreasing saturated fats to help prevent heart disease.  Obesity Kristi Huynh is currently in the action stage of change. As such, her goal is to continue with weight loss efforts She has agreed to change to follow a lower carbohydrate, vegetable and lean protein rich diet plan before going on vacation Kristi Huynh has been instructed to work up to a goal of 150 minutes of combined cardio and strengthening exercise per week for weight loss and overall health benefits. We discussed the following Behavioral Modification Strategies today: increasing lean protein intake, holiday eating strategies and travel eating strategies  Kristi Huynh has agreed to follow up with our clinic in 3 weeks. She was informed of the importance of frequent follow up visits to maximize her success with intensive lifestyle modifications for her multiple health conditions.  I, Kristi Huynh, am acting as transcriptionist for Kristi Nip, MD  I have reviewed the above documentation for accuracy and completeness, and I agree with the above. -Kristi Nip, MD    OBESITY BEHAVIORAL INTERVENTION VISIT  Today's visit was # 15 out of 64.  Starting weight: 205 lbs Starting date: 10/09/16 Today's weight : 185 lbs Today's date: 06/09/2017 Total lbs lost to date: 20 (Patients must lose 7 lbs in the first 6 months to continue with counseling)   ASK: We discussed the diagnosis of obesity with Kristi Huynh today and Kristi Huynh agreed to give [name] permission to discuss obesity behavioral modification therapy today.  ASSESS: Kristi Huynh has the diagnosis of obesity and her BMI today is 32.78 Kristi Huynh is in the action stage of  change   ADVISE: Kristi Huynh was educated on the multiple health risks of obesity as well as the benefit of weight  loss to improve her health. She was advised of the need for long term treatment and the importance of lifestyle modifications.  AGREE: Multiple dietary modification options and treatment options were discussed and  Lilyanna agreed to change to follow a lower carbohydrate, vegetable and lean protein rich diet plan before going on vacation We discussed the following Behavioral Modification Strategies today: increasing lean protein intake, holiday eating strategies and travel eating strategies

## 2017-06-22 MED FILL — VIT D2 1.25 MG (50,000 UNIT: 1.25 MG | 28 days supply | Qty: 4 | Fill #0

## 2017-06-22 MED FILL — metFORMIN HCL 500 MG TABS: 500 | 30 days supply | Qty: 60 | Fill #0

## 2017-06-29 ENCOUNTER — Encounter (INDEPENDENT_AMBULATORY_CARE_PROVIDER_SITE_OTHER): Payer: Self-pay

## 2017-06-29 ENCOUNTER — Ambulatory Visit (INDEPENDENT_AMBULATORY_CARE_PROVIDER_SITE_OTHER): Payer: 59 | Admitting: Family Medicine

## 2017-07-02 ENCOUNTER — Ambulatory Visit (INDEPENDENT_AMBULATORY_CARE_PROVIDER_SITE_OTHER): Payer: 59 | Admitting: Physician Assistant

## 2017-07-02 ENCOUNTER — Encounter (INDEPENDENT_AMBULATORY_CARE_PROVIDER_SITE_OTHER): Payer: Self-pay

## 2017-07-02 ENCOUNTER — Ambulatory Visit (INDEPENDENT_AMBULATORY_CARE_PROVIDER_SITE_OTHER): Payer: 59 | Admitting: Family Medicine

## 2017-07-02 DIAGNOSIS — Z9189 Other specified personal risk factors, not elsewhere classified: Secondary | ICD-10-CM

## 2017-07-02 DIAGNOSIS — E669 Obesity, unspecified: Secondary | ICD-10-CM

## 2017-07-02 DIAGNOSIS — I10 Essential (primary) hypertension: Secondary | ICD-10-CM

## 2017-07-02 DIAGNOSIS — Z6833 Body mass index (BMI) 33.0-33.9, adult: Secondary | ICD-10-CM

## 2017-07-02 MED ORDER — HYDROCHLOROTHIAZIDE 12.5 MG PO TABS: 12.5000 mg | ORAL_TABLET | Freq: Every day | ORAL | 0 refills | Status: DC

## 2017-07-02 MED FILL — HYDROCHLOROTHIAZIDE 12.5 MG: 12.5 | 30 days supply | Qty: 30 | Fill #0
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
## 2017-07-16 ENCOUNTER — Ambulatory Visit (INDEPENDENT_AMBULATORY_CARE_PROVIDER_SITE_OTHER): Payer: 59 | Admitting: Physician Assistant

## 2017-07-16 VITALS — BP 114/72 | HR 80 | Temp 98.0°F | Ht 63.0 in | Wt 186.0 lb

## 2017-07-16 DIAGNOSIS — Z9189 Other specified personal risk factors, not elsewhere classified: Secondary | ICD-10-CM

## 2017-07-16 DIAGNOSIS — E669 Obesity, unspecified: Secondary | ICD-10-CM

## 2017-07-16 DIAGNOSIS — E559 Vitamin D deficiency, unspecified: Secondary | ICD-10-CM

## 2017-07-16 DIAGNOSIS — Z6833 Body mass index (BMI) 33.0-33.9, adult: Secondary | ICD-10-CM

## 2017-07-16 DIAGNOSIS — E8881 Metabolic syndrome: Secondary | ICD-10-CM | POA: Diagnosis not present

## 2017-07-16 MED ORDER — VITAMIN D (ERGOCALCIFEROL) 1.25 MG (50000 UNIT) PO CAPS: 50000.0000 [IU] | ORAL_CAPSULE | ORAL | 0 refills | Status: DC

## 2017-07-16 MED ORDER — METFORMIN HCL 500 MG PO TABS: 500.0000 mg | ORAL_TABLET | Freq: Two times a day (BID) | ORAL | 0 refills | Status: DC

## 2017-07-16 MED FILL — VIT D2 1.25 MG (50,000 UNIT: 1.25 MG | 28 days supply | Qty: 4 | Fill #0

## 2017-07-20 DIAGNOSIS — E669 Obesity, unspecified: Secondary | ICD-10-CM | POA: Insufficient documentation

## 2017-07-20 DIAGNOSIS — Z6833 Body mass index (BMI) 33.0-33.9, adult: Secondary | ICD-10-CM

## 2017-07-20 NOTE — Progress Notes (Signed)
Office: [phone]  /  Fax: [phone]   HPI:   Chief Complaint: OBESITY Kristi Huynh is here to discuss her progress with her obesity treatment plan. She is on the  follow the Category 2 plan and is following her eating plan approximately 80 % of the time. She states she is exercising 30 minutes 1 times per week. Marquesa maintained her weight. She plans her meals well but has not been getting her protein in. She continues to make smarter food choices and controls her portions. She would like more meal planning ideas. Her weight is 186 lb (84.4 kg) today and has maintained her weight  since her last visit. She has lost 19 lbs since starting treatment with [name].  Vitamin D deficiency Kristi Huynh has a diagnosis of vitamin D deficiency. She is currently taking vit D and denies nausea, vomiting or muscle weakness.  Insulin Resistance Kristi Huynh has a diagnosis of insulin resistance based on her elevated fasting insulin level >5. Although Kristi Huynh blood glucose readings are still under good control, insulin resistance puts her at greater risk of metabolic syndrome and diabetes. She is taking metformin currently and continues to work on diet and exercise to decrease risk of diabetes.  At risk for osteopenia and osteoporosis Kristi Huynh is at higher risk of osteopenia and osteoporosis due to vitamin D deficiency.    ALLERGIES: Allergies  Allergen Reactions  . Codeine   . Oxycodone Hcl   . Penicillins   . Sulfonamide Derivatives     MEDICATIONS: Current Outpatient Prescriptions on File Prior to Visit  Medication Sig Dispense Refill  . Ascorbic Acid (VITA-C PO) Take 2 tablets by mouth every morning.    . hydrochlorothiazide (HYDRODIURIL) 12.5 MG tablet Take 1 tablet (12.5 mg total) by mouth daily. 30 tablet 0  . Loratadine-Pseudoephedrine (HCA ALLERGY/NASAL DECONGESTANT PO) Take 1 tablet by mouth every morning.    . Melatonin 10 MG TABS Take 1 tablet by mouth at bedtime.    . Misc Natural Products (ENERGY  FOCUS) TABS Take 1 tablet by mouth every morning.    . Multiple Vitamin (MULTIVITAMIN) tablet Take 1 tablet by mouth every morning.    . Omega-3 Fatty Acids (FISH OIL) 1000 MG CAPS Take 1 capsule by mouth every morning.    . polyethylene glycol powder (GLYCOLAX/MIRALAX) powder Take 17 g by mouth every morning. 3350 g 0  . Probiotic Product (PROBIOTIC-10 PO) Take 1 tablet by mouth every morning.    Kristi Huynh 150 MG TABS Take 4 tablets by mouth every morning.    . vitamin B-12 (CYANOCOBALAMIN) 1000 MCG tablet Take 1,000 mcg by mouth daily.    . Wheat Dextrin (BENEFIBER) POWD Take 1 scoop by mouth 2 (two) times daily. One TBSP BID  0   No current facility-administered medications on file prior to visit.     PAST MEDICAL HISTORY: Past Medical History:  Diagnosis Date  . Anxiety   . back pain   . Depression   . joint pain   . Swelling    feet and legs    PAST SURGICAL HISTORY: Past Surgical History:  Procedure Laterality Date  . ANKLE SURGERY     Right 2 times, once as teen and once at age 40  . CYST REMOVAL HAND     Right hand age 81 and 47  . LAPAROSCOPY     endometrosis  . TONSILLECTOMY AND ADENOIDECTOMY     51 years old    SOCIAL HISTORY: Social History  Substance  Use Topics  . Smoking status: Never Smoker  . Smokeless tobacco: Never Used  . Alcohol use Not on file    FAMILY HISTORY: Family History  Problem Relation Age of Onset  . Diabetes Mother   . Hypertension Mother   . Hyperlipidemia Mother   . Thyroid disease Mother   . Depression Mother   . Anxiety disorder Mother   . Eating disorder Mother   . Obesity Mother   . Heart disease Father   . Sudden death Father     ROS: Review of Systems  Constitutional: Negative for weight loss.  Gastrointestinal: Negative for nausea and vomiting.  Musculoskeletal:       Negative muscle weakness  Endo/Heme/Allergies:       Negative polyphagia  All other systems reviewed and are negative.   PHYSICAL  EXAM: Blood pressure 114/72, pulse 80, temperature 98 F (36.7 C), temperature source Oral, height 5\' 3"  (1.6 m), weight 186 lb (84.4 kg), SpO2 98 %. Body mass index is 32.95 kg/m. Physical Exam  Constitutional: She is oriented to person, place, and time. She appears well-developed and well-nourished.  HENT:  Head: Normocephalic.  Eyes: EOM are normal.  Neck: Normal range of motion.  Pulmonary/Chest: Effort normal.  Neurological: She is alert and oriented to person, place, and time.  Skin: Skin is warm and dry.  Psychiatric: She has a normal mood and affect. Her behavior is normal.  Vitals reviewed.   RECENT LABS AND TESTS: BMET    Component Value Date/Time   NA 140 03/11/2017 0816   K 3.8 03/11/2017 0816   CL 97 03/11/2017 0816   CO2 26 03/11/2017 0816   GLUCOSE 101 (H) 03/11/2017 0816   GLUCOSE 93 12/03/2009 2056   BUN 13 03/11/2017 0816   CREATININE 0.74 03/11/2017 0816   CALCIUM 9.5 03/11/2017 0816   GFRNONAA 93 03/11/2017 0816   GFRAA 108 03/11/2017 0816   Lab Results  Component Value Date   HGBA1C 5.6 03/11/2017   HGBA1C 5.4 10/09/2016   Lab Results  Component Value Date   INSULIN 21.3 03/11/2017   INSULIN 14.4 10/09/2016   CBC    Component Value Date/Time   WBC 8.8 10/09/2016 1442   WBC 6.2 10/24/2008 2117   RBC 4.84 10/09/2016 1442   RBC 4.64 10/24/2008 2117   HGB 14.4 10/09/2016 1442   HCT 42.4 10/09/2016 1442   PLT 252 10/24/2008 2117   MCV 88 10/09/2016 1442   MCH 29.8 10/09/2016 1442   MCHC 34.0 10/09/2016 1442   MCHC 32.6 10/24/2008 2117   RDW 13.7 10/09/2016 1442   LYMPHSABS 2.8 10/09/2016 1442   EOSABS 0.1 10/09/2016 1442   BASOSABS 0.1 10/09/2016 1442   Iron/TIBC/Ferritin/ %Sat No results found for: IRON, TIBC, FERRITIN, IRONPCTSAT Lipid Panel     Component Value Date/Time   CHOL 181 03/11/2017 0816   TRIG 224 (H) 03/11/2017 0816   HDL 37 (L) 03/11/2017 0816   CHOLHDL 4.7 Ratio 12/03/2009 2056   VLDL 27 12/03/2009 2056   LDLCALC  99 03/11/2017 0816   Hepatic Function Panel     Component Value Date/Time   PROT 7.6 03/11/2017 0816   ALBUMIN 4.5 03/11/2017 0816   AST 27 03/11/2017 0816   ALT 19 03/11/2017 0816   ALKPHOS 69 03/11/2017 0816   BILITOT 0.6 03/11/2017 0816      Component Value Date/Time   TSH 1.970 10/09/2016 1442   TSH 1.990 12/03/2009 2056   TSH 2.142 10/24/2008 2117  ASSESSMENT AND PLAN: Insulin resistance - Plan: metFORMIN (GLUCOPHAGE) 500 MG tablet  Vitamin D deficiency - Plan: Vitamin D, Ergocalciferol, (DRISDOL) 50000 units CAPS capsule  At risk for osteoporosis  Class 1 obesity with serious comorbidity and body mass index (BMI) of 33.0 to 33.9 in adult, unspecified obesity type  PLAN: Vitamin D Deficiency Kristi Huynh was informed that low vitamin D levels contributes to fatigue and are associated with obesity, breast, and colon cancer. She agrees to continue to take prescription Vit D @50 ,000 IU every week  In which a prescription was written today for a 30 day supply and will follow up for routine testing of vitamin D, at least 2-3 times per year. She was informed of the risk of over-replacement of vitamin D and agrees to not increase her dose unless he discusses this with [name] first.  Insulin Resistance Kristi Huynh will continue to work on weight loss, exercise, and decreasing simple carbohydrates in her diet to help decrease the risk of diabetes. We dicussed metformin including benefits and risks. She was informed that eating too many simple carbohydrates or too many calories at one sitting increases the likelihood of GI side effects. Kristi Huynh did not need a prescription metformin for now and prescription was not written today. Kristi Huynh agreed to follow up with [name] as directed to monitor her progress.   At risk for osteopenia and osteoporosis Kristi Huynh is at risk for osteopenia and osteoporsis due to her vitamin D deficiency. She was encouraged to take her vitamin D and follow her higher calcium diet and  increase strengthening exercise to help strengthen her bones and decrease her risk of osteopenia and osteoporosis.  Obesity Kristi Huynh is currently in the action stage of change. As such, her goal is to continue with weight loss efforts She has agreed to follow the Category 2 plan Kristi Huynh has been instructed to work up to a goal of 150 minutes of combined cardio and strengthening exercise per week for weight loss and overall health benefits. We discussed the following Behavioral Modification Stratagies today: increasing lean protein intake and work on meal planning and easy cooking plans  Kristi Huynh has agreed to follow up with our clinic in 3 weeks. She was informed of the importance of frequent follow up visits to maximize her success with intensive lifestyle modifications for her multiple health conditions.  I, April Moore , am acting as Location manager for [employer], PA-C  I have reviewed the above documentation for accuracy and completeness, and I agree with the above. -Lacy Duverney, PA-C  I have reviewed the above note and agree with the plan. Dennard Nip, MD    Office: [phone]  /  Fax: [phone]  OBESITY BEHAVIORAL INTERVENTION VISIT  Today's visit was # 17 out of 22.  Starting weight: 205 Starting date: 10/09/17 Today's weight : Weight: 186 lb (84.4 kg)  Today's date: 07/21/2017 Total lbs lost to date: 49 (Patients must lose 7 lbs in the first 6 months to continue with counseling)   ASK: We discussed the diagnosis of obesity with Kristi Huynh today and Kristi Huynh agreed to give [name] permission to discuss obesity behavioral modification therapy today.  ASSESS: Kristi Huynh has the diagnosis of obesity and her BMI today is 32.9 Kristi Huynh is in the action stage of change   ADVISE: Dae was educated on the multiple health risks of obesity as well as the benefit of weight loss to improve her health. She was advised of the need for long term treatment and the importance of lifestyle  modifications.  AGREE: Multiple dietary modification options and treatment options were discussed and  Kristi Huynh agreed to keep a food journal with 500 calories and 40 protein and follow the category 2 meal plan. We discussed the following Behavioral Modification Stratagies today: increasing lean protein intake and work on meal planning and easy cooking plans

## 2017-07-30 ENCOUNTER — Ambulatory Visit (INDEPENDENT_AMBULATORY_CARE_PROVIDER_SITE_OTHER): Payer: 59 | Admitting: Physician Assistant

## 2017-08-05 ENCOUNTER — Ambulatory Visit (INDEPENDENT_AMBULATORY_CARE_PROVIDER_SITE_OTHER): Payer: 59 | Admitting: Physician Assistant

## 2017-08-05 VITALS — BP 120/72 | HR 71 | Temp 98.3°F | Ht 63.0 in | Wt 188.0 lb

## 2017-08-05 DIAGNOSIS — Z9189 Other specified personal risk factors, not elsewhere classified: Secondary | ICD-10-CM

## 2017-08-05 DIAGNOSIS — E669 Obesity, unspecified: Secondary | ICD-10-CM | POA: Diagnosis not present

## 2017-08-05 DIAGNOSIS — E559 Vitamin D deficiency, unspecified: Secondary | ICD-10-CM | POA: Diagnosis not present

## 2017-08-05 DIAGNOSIS — Z6833 Body mass index (BMI) 33.0-33.9, adult: Secondary | ICD-10-CM

## 2017-08-05 DIAGNOSIS — I10 Essential (primary) hypertension: Secondary | ICD-10-CM | POA: Diagnosis not present

## 2017-08-05 MED ORDER — HYDROCHLOROTHIAZIDE 12.5 MG PO TABS: 12.5000 mg | ORAL_TABLET | Freq: Every day | ORAL | 0 refills | Status: DC

## 2017-08-05 MED ORDER — VITAMIN D (ERGOCALCIFEROL) 1.25 MG (50000 UNIT) PO CAPS: 50000.0000 [IU] | ORAL_CAPSULE | ORAL | 0 refills | Status: DC

## 2017-08-05 MED FILL — HYDROCHLOROTHIAZIDE 12.5 MG: 12.5 | 30 days supply | Qty: 30 | Fill #0

## 2017-08-05 NOTE — Progress Notes (Signed)
Office: [phone]  /  Fax: [phone]   HPI:   Chief Complaint: OBESITY Kristi Huynh is here to discuss her progress with her obesity treatment plan. She is on the Category 2 plan and is following her eating plan approximately 75 % of the time. She states she is exercising 0 minutes 0 times per week. Kristi Huynh is frustrated she has gained weight. She follows the plan and manages to make better food choices. She would like more meal planning ideas. Her weight is 188 lb (85.3 kg) today and has gained 2 pounds since her last visit. She has lost 17 lbs since starting treatment with [name].  Kristi Huynh is a 51 y.o. female with Kristi. Kristi Huynh denies chest pain or shortness of breath. She is working weight loss to help control her blood pressure with the goal of decreasing her risk of heart attack and stroke. Kristi Huynh blood pressure is currently controlled.  At risk for cardiovascular disease Kristi Huynh is at a higher than average risk for cardiovascular disease due to obesity and Kristi. She currently denies any chest pain.  Vitamin D deficiency Kristi Huynh has a diagnosis of vitamin D deficiency. She is currently taking prescription Vit D and denies nausea, vomiting or muscle weakness.  ALLERGIES: Allergies  Allergen Reactions  . Codeine   . Oxycodone Hcl   . Penicillins   . Sulfonamide Derivatives     MEDICATIONS: Current Outpatient Prescriptions on File Prior to Visit  Medication Sig Dispense Refill  . Ascorbic Acid (VITA-C PO) Take 2 tablets by mouth every morning.    . Loratadine-Pseudoephedrine (HCA ALLERGY/NASAL DECONGESTANT PO) Take 1 tablet by mouth every morning.    . Melatonin 10 MG TABS Take 1 tablet by mouth at bedtime.    . metFORMIN (GLUCOPHAGE) 500 MG tablet Take 1 tablet (500 mg total) by mouth 2 (two) times daily with a meal. 60 tablet 0  . Misc Natural Products (ENERGY FOCUS) TABS Take 1 tablet by mouth every morning.    . Multiple Vitamin  (MULTIVITAMIN) tablet Take 1 tablet by mouth every morning.    . Omega-3 Fatty Acids (FISH OIL) 1000 MG CAPS Take 1 capsule by mouth every morning.    . polyethylene glycol powder (GLYCOLAX/MIRALAX) powder Take 17 g by mouth every morning. 3350 g 0  . Probiotic Product (PROBIOTIC-10 PO) Take 1 tablet by mouth every morning.    [name] 150 MG TABS Take 4 tablets by mouth every morning.    . vitamin B-12 (CYANOCOBALAMIN) 1000 MCG tablet Take 1,000 mcg by mouth daily.    . Wheat Dextrin (BENEFIBER) POWD Take 1 scoop by mouth 2 (two) times daily. One TBSP BID  0   No current facility-administered medications on file prior to visit.     PAST MEDICAL HISTORY: Past Medical History:  Diagnosis Date  . Anxiety   . back pain   . Depression   . joint pain   . Swelling    feet and legs    PAST SURGICAL HISTORY: Past Surgical History:  Procedure Laterality Date  . ANKLE SURGERY     Right 2 times, once as teen and once at age 45  . CYST REMOVAL HAND     Right hand age 50 and 1  . LAPAROSCOPY     endometrosis  . TONSILLECTOMY AND ADENOIDECTOMY     51 years old    SOCIAL HISTORY: Social History  Substance Use Topics  . Smoking status: Never Smoker  .  Smokeless tobacco: Never Used  . Alcohol use Not on file    FAMILY HISTORY: Family History  Problem Relation Age of Onset  . Diabetes Mother   . Kristi Mother   . Hyperlipidemia Mother   . Thyroid disease Mother   . Depression Mother   . Anxiety disorder Mother   . Eating disorder Mother   . Obesity Mother   . Heart disease Father   . Sudden death Father     ROS: Review of Systems  Constitutional: Negative for malaise/fatigue and weight loss.  Respiratory: Negative for shortness of breath.   Cardiovascular: Negative for chest pain.  Gastrointestinal: Negative for nausea and vomiting.  Musculoskeletal:       Negative muscle weakness    PHYSICAL EXAM: Blood pressure 120/72, pulse 71, temperature 98.3 F  (36.8 C), temperature source Oral, height 5\' 3"  (1.6 m), weight 188 lb (85.3 kg), SpO2 98 %. Body mass index is 33.3 kg/m. Physical Exam  Constitutional: She is oriented to person, place, and time. She appears well-developed and well-nourished.  Cardiovascular: Normal rate.   Pulmonary/Chest: Effort normal.  Musculoskeletal: Normal range of motion.  Neurological: She is oriented to person, place, and time.  Skin: Skin is warm and dry.  Psychiatric: She has a normal mood and affect. Her behavior is normal.  Vitals reviewed.   RECENT LABS AND TESTS: BMET    Component Value Date/Time   NA 140 03/11/2017 0816   K 3.8 03/11/2017 0816   CL 97 03/11/2017 0816   CO2 26 03/11/2017 0816   GLUCOSE 101 (H) 03/11/2017 0816   GLUCOSE 93 12/03/2009 2056   BUN 13 03/11/2017 0816   CREATININE 0.74 03/11/2017 0816   CALCIUM 9.5 03/11/2017 0816   GFRNONAA 93 03/11/2017 0816   GFRAA 108 03/11/2017 0816   Lab Results  Component Value Date   HGBA1C 5.6 03/11/2017   HGBA1C 5.4 10/09/2016   Lab Results  Component Value Date   INSULIN 21.3 03/11/2017   INSULIN 14.4 10/09/2016   CBC    Component Value Date/Time   WBC 8.8 10/09/2016 1442   WBC 6.2 10/24/2008 2117   RBC 4.84 10/09/2016 1442   RBC 4.64 10/24/2008 2117   HGB 14.4 10/09/2016 1442   HCT 42.4 10/09/2016 1442   PLT 252 10/24/2008 2117   MCV 88 10/09/2016 1442   MCH 29.8 10/09/2016 1442   MCHC 34.0 10/09/2016 1442   MCHC 32.6 10/24/2008 2117   RDW 13.7 10/09/2016 1442   LYMPHSABS 2.8 10/09/2016 1442   EOSABS 0.1 10/09/2016 1442   BASOSABS 0.1 10/09/2016 1442   Iron/TIBC/Ferritin/ %Sat No results found for: IRON, TIBC, FERRITIN, IRONPCTSAT Lipid Panel     Component Value Date/Time   CHOL 181 03/11/2017 0816   TRIG 224 (H) 03/11/2017 0816   HDL 37 (L) 03/11/2017 0816   CHOLHDL 4.7 Ratio 12/03/2009 2056   VLDL 27 12/03/2009 2056   LDLCALC 99 03/11/2017 0816   Hepatic Function Panel     Component Value Date/Time    PROT 7.6 03/11/2017 0816   ALBUMIN 4.5 03/11/2017 0816   AST 27 03/11/2017 0816   ALT 19 03/11/2017 0816   ALKPHOS 69 03/11/2017 0816   BILITOT 0.6 03/11/2017 0816      Component Value Date/Time   TSH 1.970 10/09/2016 1442   TSH 1.990 12/03/2009 2056   TSH 2.142 10/24/2008 2117    ASSESSMENT AND PLAN: Essential Kristi - Plan: hydrochlorothiazide (HYDRODIURIL) 12.5 MG tablet  Vitamin D deficiency - Plan:  Vitamin D, Ergocalciferol, (DRISDOL) 50000 units CAPS capsule  At risk for heart disease  Class 1 obesity with serious comorbidity and body mass index (BMI) of 33.0 to 33.9 in adult, unspecified obesity type  PLAN:  Kristi We discussed sodium restriction, working on healthy weight loss, and a regular exercise program as the means to achieve improved blood pressure control. Kristi Huynh agreed with this plan and agreed to follow up as directed. We will continue to monitor her blood pressure as well as her progress with the above lifestyle modifications. Kristi Huynh agrees to continue taking hydrochlorothiazide 12.5 mg #30 and we will refill for 1 month. She will watch for signs of hypotension as she continues her lifestyle modifications. Kristi Huynh agrees to follow up with our clinic in 2 weeks.  Cardiovascular risk counselling Kristi Huynh was given extended (15 minutes) coronary artery disease prevention counseling today. She is 51 y.o. female and has risk factors for heart disease including obesity. We discussed intensive lifestyle modifications today with an emphasis on specific weight loss instructions and strategies. Pt was also informed of the importance of increasing exercise and decreasing saturated fats to help prevent heart disease.  Vitamin D Deficiency Kristi Huynh was informed that low vitamin D levels contributes to fatigue and are associated with obesity, breast, and colon cancer. Kristi Huynh agrees to continue taking prescription Vit D @50 ,000 IU every week #4 and we will refill for 1 month. She  will follow up for routine testing of vitamin D, at least 2-3 times per year. She was informed of the risk of over-replacement of vitamin D and agrees to not increase her dose unless he discusses this with [name] first. Kristi Huynh will follow up with our clinic in 2 weeks.  Obesity Kristi Huynh is currently in the action stage of change. As such, her goal is to continue with weight loss efforts She has agreed to follow the Category 2 plan Kristi Huynh has been instructed to work up to a goal of 150 minutes of combined cardio and strengthening exercise per week for weight loss and overall health benefits. We discussed the following Behavioral Modification Strategies today: increasing lean protein intake and work on meal planning and easy cooking plans   Kristi Huynh has agreed to follow up with our clinic in 2 weeks. She was informed of the importance of frequent follow up visits to maximize her success with intensive lifestyle modifications for her multiple health conditions.  I, Trixie Dredge, am acting as transcriptionist for Lacy Duverney, PA-C  I have reviewed the above documentation for accuracy and completeness, and I agree with the above. -Lacy Duverney, PA-C  I have reviewed the above note and agree with the plan. -Dennard Nip, MD     Today's visit was # 19 out of 22.  Starting weight: 205 lbs Starting date: 10/09/16 Today's weight : 188 lbs  Today's date: 08/05/2017 Total lbs lost to date: 17 (Patients must lose 7 lbs in the first 6 months to continue with counseling)   ASK: We discussed the diagnosis of obesity with Kristi Huynh today and Kristi Huynh agreed to give [name] permission to discuss obesity behavioral modification therapy today.  ASSESS: Makaylie has the diagnosis of obesity and her BMI today is 33.31 Akaylah is in the action stage of change   ADVISE: Makeba was educated on the multiple health risks of obesity as well as the benefit of weight loss to improve her health. She was advised of the need  for long term treatment and the importance of lifestyle modifications.  AGREE: Multiple dietary modification options and treatment options were discussed and  Collier agreed to follow the Category 2 plan We discussed the following Behavioral Modification Strategies today: increasing lean protein intake and work on meal planning and easy cooking plans

## 2017-08-07 MED FILL — VIT D2 1.25 MG (50,000 UNIT: 1.25 MG | 28 days supply | Qty: 4 | Fill #0

## 2017-08-18 ENCOUNTER — Ambulatory Visit (INDEPENDENT_AMBULATORY_CARE_PROVIDER_SITE_OTHER): Payer: 59 | Admitting: Physician Assistant

## 2017-08-18 VITALS — BP 111/70 | HR 84 | Temp 98.6°F | Ht 63.0 in | Wt 186.0 lb

## 2017-08-18 DIAGNOSIS — Z6833 Body mass index (BMI) 33.0-33.9, adult: Secondary | ICD-10-CM | POA: Diagnosis not present

## 2017-08-18 DIAGNOSIS — I10 Essential (primary) hypertension: Secondary | ICD-10-CM

## 2017-08-18 DIAGNOSIS — E669 Obesity, unspecified: Secondary | ICD-10-CM

## 2017-08-18 NOTE — Progress Notes (Signed)
Office: [phone]  /  Fax: [phone]   HPI:   Chief Complaint: OBESITY Kristi Huynh is here to discuss her progress with her obesity treatment plan. She is on the Category 2 plan and is following her eating plan approximately 70 % of the time. She states she is exercising 0 minutes 0 times per week. Steph continues to do well with weight loss. She has been working on increasing her lean protein intake. She would like more variety at dinner. Her weight is 186 lb (84.4 kg) today and has had a weight loss of 2 pounds over a period of 2 weeks since her last visit. She has lost 19 lbs since starting treatment with [name].  Hypertension DORMA ALTMAN is a 51 y.o. female with hypertension. [name] denies chest pain or shortness of breath on exertion. She is working weight loss to help control her blood pressure with the goal of decreasing her risk of heart attack and stroke. Tishas blood pressure is currently stable.  ALLERGIES: Allergies  Allergen Reactions  . Codeine   . Oxycodone Hcl   . Penicillins   . Sulfonamide Derivatives     MEDICATIONS: Current Outpatient Medications on File Prior to Visit  Medication Sig Dispense Refill  . Ascorbic Acid (VITA-C PO) Take 2 tablets by mouth every morning.    . hydrochlorothiazide (HYDRODIURIL) 12.5 MG tablet Take 1 tablet (12.5 mg total) by mouth daily. 30 tablet 0  . Loratadine-Pseudoephedrine (HCA ALLERGY/NASAL DECONGESTANT PO) Take 1 tablet by mouth every morning.    . Melatonin 10 MG TABS Take 1 tablet by mouth at bedtime.    . metFORMIN (GLUCOPHAGE) 500 MG tablet Take 1 tablet (500 mg total) by mouth 2 (two) times daily with a meal. 60 tablet 0  . Misc Natural Products (ENERGY FOCUS) TABS Take 1 tablet by mouth every morning.    . Multiple Vitamin (MULTIVITAMIN) tablet Take 1 tablet by mouth every morning.    . Omega-3 Fatty Acids (FISH OIL) 1000 MG CAPS Take 1 capsule by mouth every morning.    . polyethylene glycol powder  (GLYCOLAX/MIRALAX) powder Take 17 g by mouth every morning. 3350 g 0  . Probiotic Product (PROBIOTIC-10 PO) Take 1 tablet by mouth every morning.    [name] 150 MG TABS Take 4 tablets by mouth every morning.    . vitamin B-12 (CYANOCOBALAMIN) 1000 MCG tablet Take 1,000 mcg by mouth daily.    . Vitamin D, Ergocalciferol, (DRISDOL) 50000 units CAPS capsule Take 1 capsule (50,000 Units total) by mouth every 7 (seven) days. 4 capsule 0  . Wheat Dextrin (BENEFIBER) POWD Take 1 scoop by mouth 2 (two) times daily. One TBSP BID  0   No current facility-administered medications on file prior to visit.     PAST MEDICAL HISTORY: Past Medical History:  Diagnosis Date  . Anxiety   . back pain   . Depression   . joint pain   . Swelling    feet and legs    PAST SURGICAL HISTORY: Past Surgical History:  Procedure Laterality Date  . ANKLE SURGERY     Right 2 times, once as teen and once at age 31  . CYST REMOVAL HAND     Right hand age 20 and 61  . LAPAROSCOPY     endometrosis  . TONSILLECTOMY AND ADENOIDECTOMY     50 years old    SOCIAL HISTORY: Social History   Tobacco Use  . Smoking status: Never  Smoker  . Smokeless tobacco: Never Used  Substance Use Topics  . Alcohol use: Not on file  . Drug use: Not on file    FAMILY HISTORY: Family History  Problem Relation Age of Onset  . Diabetes Mother   . Hypertension Mother   . Hyperlipidemia Mother   . Thyroid disease Mother   . Depression Mother   . Anxiety disorder Mother   . Eating disorder Mother   . Obesity Mother   . Heart disease Father   . Sudden death Father     ROS: Review of Systems  Constitutional: Positive for weight loss.  Respiratory: Negative for shortness of breath (on exertion).   Cardiovascular: Negative for chest pain.    PHYSICAL EXAM: Blood pressure 111/70, pulse 84, temperature 98.6 F (37 C), temperature source Oral, height 5\' 3"  (1.6 m), weight 186 lb (84.4 kg), SpO2 97 %. Body mass  index is 32.95 kg/m. Physical Exam  Constitutional: She is oriented to person, place, and time. She appears well-developed and well-nourished.  Cardiovascular: Normal rate.  Pulmonary/Chest: Effort normal.  Musculoskeletal: Normal range of motion.  Neurological: She is oriented to person, place, and time.  Skin: Skin is warm and dry.  Psychiatric: She has a normal mood and affect. Her behavior is normal.  Vitals reviewed.   RECENT LABS AND TESTS: BMET    Component Value Date/Time   NA 140 03/11/2017 0816   K 3.8 03/11/2017 0816   CL 97 03/11/2017 0816   CO2 26 03/11/2017 0816   GLUCOSE 101 (H) 03/11/2017 0816   GLUCOSE 93 12/03/2009 2056   BUN 13 03/11/2017 0816   CREATININE 0.74 03/11/2017 0816   CALCIUM 9.5 03/11/2017 0816   GFRNONAA 93 03/11/2017 0816   GFRAA 108 03/11/2017 0816   Lab Results  Component Value Date   HGBA1C 5.6 03/11/2017   HGBA1C 5.4 10/09/2016   Lab Results  Component Value Date   INSULIN 21.3 03/11/2017   INSULIN 14.4 10/09/2016   CBC    Component Value Date/Time   WBC 8.8 10/09/2016 1442   WBC 6.2 10/24/2008 2117   RBC 4.84 10/09/2016 1442   RBC 4.64 10/24/2008 2117   HGB 14.4 10/09/2016 1442   HCT 42.4 10/09/2016 1442   PLT 252 10/24/2008 2117   MCV 88 10/09/2016 1442   MCH 29.8 10/09/2016 1442   MCHC 34.0 10/09/2016 1442   MCHC 32.6 10/24/2008 2117   RDW 13.7 10/09/2016 1442   LYMPHSABS 2.8 10/09/2016 1442   EOSABS 0.1 10/09/2016 1442   BASOSABS 0.1 10/09/2016 1442   Iron/TIBC/Ferritin/ %Sat No results found for: IRON, TIBC, FERRITIN, IRONPCTSAT Lipid Panel     Component Value Date/Time   CHOL 181 03/11/2017 0816   TRIG 224 (H) 03/11/2017 0816   HDL 37 (L) 03/11/2017 0816   CHOLHDL 4.7 Ratio 12/03/2009 2056   VLDL 27 12/03/2009 2056   LDLCALC 99 03/11/2017 0816   Hepatic Function Panel     Component Value Date/Time   PROT 7.6 03/11/2017 0816   ALBUMIN 4.5 03/11/2017 0816   AST 27 03/11/2017 0816   ALT 19 03/11/2017  0816   ALKPHOS 69 03/11/2017 0816   BILITOT 0.6 03/11/2017 0816      Component Value Date/Time   TSH 1.970 10/09/2016 1442   TSH 1.990 12/03/2009 2056   TSH 2.142 10/24/2008 2117    ASSESSMENT AND PLAN: Essential hypertension  Class 1 obesity with serious comorbidity and body mass index (BMI) of 33.0 to 33.9 in adult,  unspecified obesity type  PLAN:  Hypertension We discussed sodium restriction, working on healthy weight loss, and a regular exercise program as the means to achieve improved blood pressure control. Iridiana agreed with this plan and agreed to follow up as directed. We will continue to monitor her blood pressure as well as her progress with the above lifestyle modifications. She will continue her medications as prescribed and will watch for signs of hypotension as she continues her lifestyle modifications.  We spent > than 50% of the 15 minute visit on the counseling as documented in the note.  Obesity Keyairra is currently in the action stage of change. As such, her goal is to continue with weight loss efforts She has agreed to keep a food journal with 500 calories and 40 grams of protein at supper daily and follow the Category 2 plan Kaylon has been instructed to work up to a goal of 150 minutes of combined cardio and strengthening exercise per week for weight loss and overall health benefits. We discussed the following Behavioral Modification Strategies today: increasing lean protein intake and keep a strict food journal.  Inesha has agreed to follow up with our clinic in 2 weeks. She was informed of the importance of frequent follow up visits to maximize her success with intensive lifestyle modifications for her multiple health conditions.  I, Doreene Nest, am acting as transcriptionist for Lacy Duverney, PA-C  I have reviewed the above documentation for accuracy and completeness, and I agree with the above. -Lacy Duverney, PA-C  I have reviewed the above note and agree  with the plan. -Dennard Nip, MD   OBESITY BEHAVIORAL INTERVENTION VISIT  Today's visit was # 20 out of 22.  Starting weight: 205 lbs Starting date: 10/09/16 Today's weight : 186 lbs Today's date: 08/18/2017 Total lbs lost to date: 27 (Patients must lose 7 lbs in the first 6 months to continue with counseling)   ASK: We discussed the diagnosis of obesity with [name] today and Fanny agreed to give [name] permission to discuss obesity behavioral modification therapy today.  ASSESS: Scott has the diagnosis of obesity and her BMI today is 32.96 Shama is in the action stage of change   ADVISE: Kameah was educated on the multiple health risks of obesity as well as the benefit of weight loss to improve her health. She was advised of the need for long term treatment and the importance of lifestyle modifications.  AGREE: Multiple dietary modification options and treatment options were discussed and  Yoltzin agreed to keep a food journal with 500 calories and 40 grams of protein at supper daily and follow the Category 2 plan We discussed the following Behavioral Modification Strategies today: increasing lean protein intake and keep a strict food journal

## 2017-09-01 ENCOUNTER — Ambulatory Visit (INDEPENDENT_AMBULATORY_CARE_PROVIDER_SITE_OTHER): Payer: 59 | Admitting: Physician Assistant

## 2017-09-01 VITALS — BP 117/75 | HR 71 | Temp 98.1°F | Ht 63.0 in | Wt 186.0 lb

## 2017-09-01 DIAGNOSIS — E669 Obesity, unspecified: Secondary | ICD-10-CM | POA: Diagnosis not present

## 2017-09-01 DIAGNOSIS — Z6833 Body mass index (BMI) 33.0-33.9, adult: Secondary | ICD-10-CM

## 2017-09-01 DIAGNOSIS — R7303 Prediabetes: Secondary | ICD-10-CM | POA: Diagnosis not present

## 2017-09-01 DIAGNOSIS — I10 Essential (primary) hypertension: Secondary | ICD-10-CM | POA: Diagnosis not present

## 2017-09-01 DIAGNOSIS — Z9189 Other specified personal risk factors, not elsewhere classified: Secondary | ICD-10-CM | POA: Diagnosis not present

## 2017-09-01 DIAGNOSIS — E559 Vitamin D deficiency, unspecified: Secondary | ICD-10-CM

## 2017-09-01 MED ORDER — METFORMIN HCL 500 MG PO TABS: 500.0000 mg | ORAL_TABLET | Freq: Two times a day (BID) | ORAL | 0 refills | Status: DC

## 2017-09-01 MED ORDER — HYDROCHLOROTHIAZIDE 12.5 MG PO TABS: 12.5000 mg | ORAL_TABLET | Freq: Every day | ORAL | 0 refills | Status: DC

## 2017-09-01 MED ORDER — VITAMIN D (ERGOCALCIFEROL) 1.25 MG (50000 UNIT) PO CAPS: 50000.0000 [IU] | ORAL_CAPSULE | ORAL | 0 refills | Status: DC

## 2017-09-01 NOTE — Progress Notes (Signed)
Office: [phone]  /  Fax: [phone]   HPI:   Chief Complaint: OBESITY Kristi Huynh is here to discuss her progress with her obesity treatment plan. She is on the Category 2 plan and is following her eating plan approximately 75 % of the time. She states she is exercising stairs for 10 minutes 5 times per week. Kaysie maintained her weight. She has been incorporating variety into her dinner meals. She would like holiday eating strategies. Her weight is 186 lb (84.4 kg) today and has maintained weight over a period of 2 weeks since her last visit. She has lost 19 lbs since starting treatment with [name].  Hypertension Kristi Huynh is a 51 y.o. female with hypertension. Kristi Huynh denies chest pain or shortness of breath on exertion. She is working weight loss to help control her blood pressure with the goal of decreasing her risk of heart attack and stroke. Kristi Huynh blood pressure is currently stable.  At risk for cardiovascular disease Kristi Huynh is at a higher than average risk for cardiovascular disease due to obesity and hypertension. She currently denies any chest pain.  Vitamin D deficiency Kristi Huynh has a diagnosis of vitamin D deficiency. She is currently taking vit D and denies nausea, vomiting or muscle weakness.  Pre-Diabetes Kristi Huynh has a diagnosis of pre-diabetes based on her elevated Hgb A1c and was informed this puts her at greater risk of developing diabetes. She is taking metformin currently and continues to work on diet and exercise to decrease risk of diabetes. She denies nausea, polyphagia or hypoglycemia.  ALLERGIES: Allergies  Allergen Reactions  . Codeine   . Oxycodone Hcl   . Penicillins   . Sulfonamide Derivatives     MEDICATIONS: Current Outpatient Medications on File Prior to Visit  Medication Sig Dispense Refill  . Ascorbic Acid (VITA-C PO) Take 2 tablets by mouth every morning.    . hydrochlorothiazide (HYDRODIURIL) 12.5 MG tablet Take 1 tablet (12.5 mg  total) by mouth daily. 30 tablet 0  . Loratadine-Pseudoephedrine (HCA ALLERGY/NASAL DECONGESTANT PO) Take 1 tablet by mouth every morning.    . Melatonin 10 MG TABS Take 1 tablet by mouth at bedtime.    . metFORMIN (GLUCOPHAGE) 500 MG tablet Take 1 tablet (500 mg total) by mouth 2 (two) times daily with a meal. 60 tablet 0  . Misc Natural Products (ENERGY FOCUS) TABS Take 1 tablet by mouth every morning.    . Multiple Vitamin (MULTIVITAMIN) tablet Take 1 tablet by mouth every morning.    . Omega-3 Fatty Acids (FISH OIL) 1000 MG CAPS Take 1 capsule by mouth every morning.    . polyethylene glycol powder (GLYCOLAX/MIRALAX) powder Take 17 g by mouth every morning. 3350 g 0  . Probiotic Product (PROBIOTIC-10 PO) Take 1 tablet by mouth every morning.    Kristi Huynh 150 MG TABS Take 4 tablets by mouth every morning.    . vitamin B-12 (CYANOCOBALAMIN) 1000 MCG tablet Take 1,000 mcg by mouth daily.    . Vitamin D, Ergocalciferol, (DRISDOL) 50000 units CAPS capsule Take 1 capsule (50,000 Units total) by mouth every 7 (seven) days. 4 capsule 0  . Wheat Dextrin (BENEFIBER) POWD Take 1 scoop by mouth 2 (two) times daily. One TBSP BID  0   No current facility-administered medications on file prior to visit.     PAST MEDICAL HISTORY: Past Medical History:  Diagnosis Date  . Anxiety   . back pain   . Depression   . joint  pain   . Swelling    feet and legs    PAST SURGICAL HISTORY: Past Surgical History:  Procedure Laterality Date  . ANKLE SURGERY     Right 2 times, once as teen and once at age 43  . CYST REMOVAL HAND     Right hand age 43 and 74  . LAPAROSCOPY     endometrosis  . TONSILLECTOMY AND ADENOIDECTOMY     51 years old    SOCIAL HISTORY: Social History   Tobacco Use  . Smoking status: Never Smoker  . Smokeless tobacco: Never Used  Substance Use Topics  . Alcohol use: Not on file  . Drug use: Not on file    FAMILY HISTORY: Family History  Problem Relation Age of  Onset  . Diabetes Mother   . Hypertension Mother   . Hyperlipidemia Mother   . Thyroid disease Mother   . Depression Mother   . Anxiety disorder Mother   . Eating disorder Mother   . Obesity Mother   . Heart disease Father   . Sudden death Father     ROS: Review of Systems  Constitutional: Negative for weight loss.  Respiratory: Negative for shortness of breath (on exertion).   Cardiovascular: Negative for chest pain.  Gastrointestinal: Negative for nausea and vomiting.  Musculoskeletal:       Negative muscle weakness  Endo/Heme/Allergies:       Negative polyphagia Negative hypoglycemia    PHYSICAL EXAM: Blood pressure 117/75, pulse 71, temperature 98.1 F (36.7 C), temperature source Oral, height 5\' 3"  (1.6 m), weight 186 lb (84.4 kg), SpO2 100 %. Body mass index is 32.95 kg/m. Physical Exam  Constitutional: She is oriented to person, place, and time. She appears well-developed and well-nourished.  Cardiovascular: Normal rate.  Pulmonary/Chest: Effort normal.  Musculoskeletal: Normal range of motion.  Neurological: She is oriented to person, place, and time.  Skin: Skin is warm and dry.  Psychiatric: She has a normal mood and affect. Her behavior is normal.  Vitals reviewed.   RECENT LABS AND TESTS: BMET    Component Value Date/Time   NA 140 03/11/2017 0816   K 3.8 03/11/2017 0816   CL 97 03/11/2017 0816   CO2 26 03/11/2017 0816   GLUCOSE 101 (H) 03/11/2017 0816   GLUCOSE 93 12/03/2009 2056   BUN 13 03/11/2017 0816   CREATININE 0.74 03/11/2017 0816   CALCIUM 9.5 03/11/2017 0816   GFRNONAA 93 03/11/2017 0816   GFRAA 108 03/11/2017 0816   Lab Results  Component Value Date   HGBA1C 5.6 03/11/2017   HGBA1C 5.4 10/09/2016   Lab Results  Component Value Date   INSULIN 21.3 03/11/2017   INSULIN 14.4 10/09/2016   CBC    Component Value Date/Time   WBC 8.8 10/09/2016 1442   WBC 6.2 10/24/2008 2117   RBC 4.84 10/09/2016 1442   RBC 4.64 10/24/2008 2117     HGB 14.4 10/09/2016 1442   HCT 42.4 10/09/2016 1442   PLT 252 10/24/2008 2117   MCV 88 10/09/2016 1442   MCH 29.8 10/09/2016 1442   MCHC 34.0 10/09/2016 1442   MCHC 32.6 10/24/2008 2117   RDW 13.7 10/09/2016 1442   LYMPHSABS 2.8 10/09/2016 1442   EOSABS 0.1 10/09/2016 1442   BASOSABS 0.1 10/09/2016 1442   Iron/TIBC/Ferritin/ %Sat No results found for: IRON, TIBC, FERRITIN, IRONPCTSAT Lipid Panel     Component Value Date/Time   CHOL 181 03/11/2017 0816   TRIG 224 (H) 03/11/2017 4196  HDL 37 (L) 03/11/2017 0816   CHOLHDL 4.7 Ratio 12/03/2009 2056   VLDL 27 12/03/2009 2056   LDLCALC 99 03/11/2017 0816   Hepatic Function Panel     Component Value Date/Time   PROT 7.6 03/11/2017 0816   ALBUMIN 4.5 03/11/2017 0816   AST 27 03/11/2017 0816   ALT 19 03/11/2017 0816   ALKPHOS 69 03/11/2017 0816   BILITOT 0.6 03/11/2017 0816      Component Value Date/Time   TSH 1.970 10/09/2016 1442   TSH 1.990 12/03/2009 2056   TSH 2.142 10/24/2008 2117    ASSESSMENT AND PLAN: Essential hypertension - Plan: hydrochlorothiazide (HYDRODIURIL) 12.5 MG tablet  Vitamin D deficiency - Plan: Vitamin D, Ergocalciferol, (DRISDOL) 50000 units CAPS capsule  Prediabetes - Plan: metFORMIN (GLUCOPHAGE) 500 MG tablet  At risk for heart disease  Class 1 obesity with serious comorbidity and body mass index (BMI) of 33.0 to 33.9 in adult, unspecified obesity type  PLAN:  Hypertension We discussed sodium restriction, working on healthy weight loss, and a regular exercise program as the means to achieve improved blood pressure control. Teriyah agreed with this plan and agreed to follow up as directed. We will continue to monitor her blood pressure as well as her progress with the above lifestyle modifications. She agrees to continue HCTZ 12.5 mg qd #30 with no refills and will watch for signs of hypotension as she continues her lifestyle modifications.  Cardiovascular risk counseling Hye was given  extended (15 minutes) coronary artery disease prevention counseling today. She is 51 y.o. female and has risk factors for heart disease including obesity and hypertension. We discussed intensive lifestyle modifications today with an emphasis on specific weight loss instructions and strategies. Pt was also informed of the importance of increasing exercise and decreasing saturated fats to help prevent heart disease.  Vitamin D Deficiency Herberta was informed that low vitamin D levels contributes to fatigue and are associated with obesity, breast, and colon cancer. She agrees to continue to take prescription Vit D @50 ,000 IU every week #4 with no refills and will follow up for routine testing of vitamin D, at least 2-3 times per year. She was informed of the risk of over-replacement of vitamin D and agrees to not increase her dose unless he discusses this with [name] first. June agrees to follow up with our clinic in 2 to 3 weeks.  Pre-Diabetes Lace will continue to work on weight loss, exercise, and decreasing simple carbohydrates in her diet to help decrease the risk of diabetes. We dicussed metformin including benefits and risks. She was informed that eating too many simple carbohydrates or too many calories at one sitting increases the likelihood of GI side effects. Wrenn requested metformin for now and a prescription was written today for metformin 500 mg bid #60 with no refills. Tihanna agreed to follow up with [name] as directed to monitor her progress.  Obesity Allea is currently in the action stage of change. As such, her goal is to continue with weight loss efforts She has agreed to keep a food journal with 500 calories and 40 grams of protein at supper daily and follow the Category 2 plan Faigy has been instructed to work up to a goal of 150 minutes of combined cardio and strengthening exercise per week for weight loss and overall health benefits. We discussed the following Behavioral Modification  Strategies today: increasing lean protein intake and holiday eating strategies   Janye has agreed to follow up with our clinic in  2 to 3 weeks. She was informed of the importance of frequent follow up visits to maximize her success with intensive lifestyle modifications for her multiple health conditions.  I, Doreene Nest, am acting as transcriptionist for Lacy Duverney, PA-C  I have reviewed the above documentation for accuracy and completeness, and I agree with the above. -Lacy Duverney, PA-C  I have reviewed the above note and agree with the plan. -Dennard Nip, MD   OBESITY BEHAVIORAL INTERVENTION VISIT  Today's visit was # 20 out of 22.  Starting weight: 205 lbs Starting date: 10/09/16 Today's weight : 186 lbs Today's date: 09/01/2017 Total lbs lost to date: 45 (Patients must lose 7 lbs in the first 6 months to continue with counseling)   ASK: We discussed the diagnosis of obesity with Kristi Huynh today and Mathea agreed to give [name] permission to discuss obesity behavioral modification therapy today.  ASSESS: Demica has the diagnosis of obesity and her BMI today is 32.96 Ajiah is in the action stage of change   ADVISE: Jahmiyah was educated on the multiple health risks of obesity as well as the benefit of weight loss to improve her health. She was advised of the need for long term treatment and the importance of lifestyle modifications.  AGREE: Multiple dietary modification options and treatment options were discussed and  Cameo agreed to keep a food journal with 500 calories and 40 grams of protein at supper daily and follow the Category 2 plan We discussed the following Behavioral Modification Strategies today: increasing lean protein intake and holiday eating strategies

## 2017-09-02 MED FILL — HYDROCHLOROTHIAZIDE 12.5 MG: 12.5 | 30 days supply | Qty: 30 | Fill #0

## 2017-09-02 MED FILL — metFORMIN HCL 500 MG TABS: 500 | 30 days supply | Qty: 60 | Fill #0

## 2017-09-02 MED FILL — VIT D2 1.25 MG (50,000 UNIT: 1.25 MG | 28 days supply | Qty: 4 | Fill #0

## 2017-09-14 DIAGNOSIS — Z1231 Encounter for screening mammogram for malignant neoplasm of breast: Secondary | ICD-10-CM | POA: Diagnosis not present

## 2017-09-14 DIAGNOSIS — Z124 Encounter for screening for malignant neoplasm of cervix: Secondary | ICD-10-CM | POA: Diagnosis not present

## 2017-09-14 DIAGNOSIS — Z01419 Encounter for gynecological examination (general) (routine) without abnormal findings: Secondary | ICD-10-CM | POA: Diagnosis not present

## 2017-09-21 ENCOUNTER — Ambulatory Visit (INDEPENDENT_AMBULATORY_CARE_PROVIDER_SITE_OTHER): Payer: 59 | Admitting: Physician Assistant

## 2017-09-24 ENCOUNTER — Ambulatory Visit (INDEPENDENT_AMBULATORY_CARE_PROVIDER_SITE_OTHER): Payer: 59 | Admitting: Physician Assistant

## 2017-09-24 VITALS — BP 117/71 | HR 83 | Temp 98.4°F | Ht 63.0 in | Wt 185.0 lb

## 2017-09-24 DIAGNOSIS — Z6832 Body mass index (BMI) 32.0-32.9, adult: Secondary | ICD-10-CM | POA: Diagnosis not present

## 2017-09-24 DIAGNOSIS — Z9189 Other specified personal risk factors, not elsewhere classified: Secondary | ICD-10-CM

## 2017-09-24 DIAGNOSIS — I10 Essential (primary) hypertension: Secondary | ICD-10-CM | POA: Diagnosis not present

## 2017-09-24 DIAGNOSIS — E559 Vitamin D deficiency, unspecified: Secondary | ICD-10-CM

## 2017-09-24 DIAGNOSIS — E66811 Obesity, class 1: Secondary | ICD-10-CM

## 2017-09-24 DIAGNOSIS — E669 Obesity, unspecified: Secondary | ICD-10-CM

## 2017-09-24 MED ORDER — VITAMIN D (ERGOCALCIFEROL) 1.25 MG (50000 UNIT) PO CAPS: 50000.0000 [IU] | ORAL_CAPSULE | ORAL | 0 refills | Status: DC

## 2017-09-24 MED FILL — VIT D2 1.25 MG (50,000 UNIT: 1.25 MG | 28 days supply | Qty: 4 | Fill #0

## 2017-09-24 NOTE — Progress Notes (Signed)
Office: [phone]  /  Fax: [phone]   HPI:   Chief Complaint: OBESITY Kristi Huynh is here to discuss her progress with her obesity treatment plan. She is on the keep a food journal with 500 calories and 40 grams of protein at supper daily and follow the Category 2 plan and is following her eating plan approximately 75 % of the time. She states she is walking 5 floors of steps 2 times a day for 5 days a week and using the treadmill for 25 minutes 2 times per week. [name] continues to do well with weight loss. She is mindful of her eating and controls her portions.  Her weight is 185 lb (83.9 kg) today and has had a weight loss of 1 pound over a period of 3 weeks since her last visit. She has lost 20 lbs since starting treatment with [name].  Vitamin D deficiency Kristi Huynh has a diagnosis of vitamin D deficiency. She is currently taking prescription Vit D and denies nausea, vomiting or muscle weakness.  At risk for osteopenia and osteoporosis Kristi Huynh is at higher risk of osteopenia and osteoporosis due to vitamin D deficiency.   Hypertension Kristi Huynh is a 51 y.o. female with hypertension. Ellenor's blood pressure is stable and she denies chest pain or shortness of breath. She is working weight loss to help control her blood pressure with the goal of decreasing her risk of heart attack and stroke. [name]'s blood pressure is not currently controlled.  ALLERGIES: Allergies  Allergen Reactions  . Codeine   . Oxycodone Hcl   . Penicillins   . Sulfonamide Derivatives     MEDICATIONS: Current Outpatient Medications on File Prior to Visit  Medication Sig Dispense Refill  . Ascorbic Acid (VITA-C PO) Take 2 tablets by mouth every morning.    . hydrochlorothiazide (HYDRODIURIL) 12.5 MG tablet Take 1 tablet (12.5 mg total) by mouth daily. 30 tablet 0  . Loratadine-Pseudoephedrine (HCA ALLERGY/NASAL DECONGESTANT PO) Take 1 tablet by mouth every morning.    . Melatonin 10 MG TABS Take 1 tablet by  mouth at bedtime.    . metFORMIN (GLUCOPHAGE) 500 MG tablet Take 1 tablet (500 mg total) by mouth 2 (two) times daily with a meal. 60 tablet 0  . Misc Natural Products (ENERGY FOCUS) TABS Take 1 tablet by mouth every morning.    . Multiple Vitamin (MULTIVITAMIN) tablet Take 1 tablet by mouth every morning.    . Omega-3 Fatty Acids (FISH OIL) 1000 MG CAPS Take 1 capsule by mouth every morning.    . polyethylene glycol powder (GLYCOLAX/MIRALAX) powder Take 17 g by mouth every morning. 3350 g 0  . Probiotic Product (PROBIOTIC-10 PO) Take 1 tablet by mouth every morning.    [name] 150 MG TABS Take 4 tablets by mouth every morning.    . vitamin B-12 (CYANOCOBALAMIN) 1000 MCG tablet Take 1,000 mcg by mouth daily.    . Wheat Dextrin (BENEFIBER) POWD Take 1 scoop by mouth 2 (two) times daily. One TBSP BID  0   No current facility-administered medications on file prior to visit.     PAST MEDICAL HISTORY: Past Medical History:  Diagnosis Date  . Anxiety   . back pain   . Depression   . joint pain   . Swelling    feet and legs    PAST SURGICAL HISTORY: Past Surgical History:  Procedure Laterality Date  . ANKLE SURGERY     Right 2 times, once as teen  and once at age 35  . CYST REMOVAL HAND     Right hand age 11 and 60  . LAPAROSCOPY     endometrosis  . TONSILLECTOMY AND ADENOIDECTOMY     51 years old    SOCIAL HISTORY: Social History   Tobacco Use  . Smoking status: Never Smoker  . Smokeless tobacco: Never Used  Substance Use Topics  . Alcohol use: Not on file  . Drug use: Not on file    FAMILY HISTORY: Family History  Problem Relation Age of Onset  . Diabetes Mother   . Hypertension Mother   . Hyperlipidemia Mother   . Thyroid disease Mother   . Depression Mother   . Anxiety disorder Mother   . Eating disorder Mother   . Obesity Mother   . Heart disease Father   . Sudden death Father     ROS: Review of Systems  Constitutional: Positive for weight loss.    Respiratory: Negative for shortness of breath.   Cardiovascular: Negative for chest pain.  Gastrointestinal: Negative for nausea and vomiting.  Musculoskeletal:       Negative muscle weakness    PHYSICAL EXAM: Blood pressure 117/71, pulse 83, temperature 98.4 F (36.9 C), temperature source Oral, height 5\' 3"  (1.6 m), weight 185 lb (83.9 kg), SpO2 97 %. Body mass index is 32.77 kg/m. Physical Exam  Constitutional: She is oriented to person, place, and time. She appears well-developed and well-nourished.  Cardiovascular: Normal rate.  Pulmonary/Chest: Effort normal.  Musculoskeletal: Normal range of motion.  Neurological: She is oriented to person, place, and time.  Skin: Skin is warm and dry.  Psychiatric: She has a normal mood and affect. Her behavior is normal.  Vitals reviewed.   RECENT LABS AND TESTS: BMET    Component Value Date/Time   NA 140 03/11/2017 0816   K 3.8 03/11/2017 0816   CL 97 03/11/2017 0816   CO2 26 03/11/2017 0816   GLUCOSE 101 (H) 03/11/2017 0816   GLUCOSE 93 12/03/2009 2056   BUN 13 03/11/2017 0816   CREATININE 0.74 03/11/2017 0816   CALCIUM 9.5 03/11/2017 0816   GFRNONAA 93 03/11/2017 0816   GFRAA 108 03/11/2017 0816   Lab Results  Component Value Date   HGBA1C 5.6 03/11/2017   HGBA1C 5.4 10/09/2016   Lab Results  Component Value Date   INSULIN 21.3 03/11/2017   INSULIN 14.4 10/09/2016   CBC    Component Value Date/Time   WBC 8.8 10/09/2016 1442   WBC 6.2 10/24/2008 2117   RBC 4.84 10/09/2016 1442   RBC 4.64 10/24/2008 2117   HGB 14.4 10/09/2016 1442   HCT 42.4 10/09/2016 1442   PLT 252 10/24/2008 2117   MCV 88 10/09/2016 1442   MCH 29.8 10/09/2016 1442   MCHC 34.0 10/09/2016 1442   MCHC 32.6 10/24/2008 2117   RDW 13.7 10/09/2016 1442   LYMPHSABS 2.8 10/09/2016 1442   EOSABS 0.1 10/09/2016 1442   BASOSABS 0.1 10/09/2016 1442   Iron/TIBC/Ferritin/ %Sat No results found for: IRON, TIBC, FERRITIN, IRONPCTSAT Lipid Panel      Component Value Date/Time   CHOL 181 03/11/2017 0816   TRIG 224 (H) 03/11/2017 0816   HDL 37 (L) 03/11/2017 0816   CHOLHDL 4.7 Ratio 12/03/2009 2056   VLDL 27 12/03/2009 2056   LDLCALC 99 03/11/2017 0816   Hepatic Function Panel     Component Value Date/Time   PROT 7.6 03/11/2017 0816   ALBUMIN 4.5 03/11/2017 0816   AST  27 03/11/2017 0816   ALT 19 03/11/2017 0816   ALKPHOS 69 03/11/2017 0816   BILITOT 0.6 03/11/2017 0816      Component Value Date/Time   TSH 1.970 10/09/2016 1442   TSH 1.990 12/03/2009 2056   TSH 2.142 10/24/2008 2117    ASSESSMENT AND PLAN: Vitamin D deficiency - Plan: Vitamin D, Ergocalciferol, (DRISDOL) 50000 units CAPS capsule  Essential hypertension  At risk for osteoporosis  Class 1 obesity with serious comorbidity and body mass index (BMI) of 32.0 to 32.9 in adult, unspecified obesity type  PLAN:  Vitamin D Deficiency Adia was informed that low vitamin D levels contributes to fatigue and are associated with obesity, breast, and colon cancer. Irlanda agrees to continue taking prescription Vit D @50 ,000 IU every week #4 and we will refill for 1 month. She will follow up for routine testing of vitamin D, at least 2-3 times per year. She was informed of the risk of over-replacement of vitamin D and agrees to not increase her dose unless he discusses this with [name] first. Isidora agrees to follow up with our clinic in 4 weeks.  At risk for osteopenia and osteoporosis Arnett is at risk for osteopenia and osteoporsis due to her vitamin D deficiency. She was encouraged to take her vitamin D and follow her higher calcium diet and increase strengthening exercise to help strengthen her bones and decrease her risk of osteopenia and osteoporosis.  Hypertension We discussed sodium restriction, working on healthy weight loss, and a regular exercise program as the means to achieve improved blood pressure control. Porchia agreed with this plan and agreed to follow up as  directed. We will continue to monitor her blood pressure as well as her progress with the above lifestyle modifications. She will continue her medications as prescribed and will watch for signs of hypotension as she continues her lifestyle modifications. Serene agrees to follow up with our clinic in 4 weeks.  Obesity Nariyah is currently in the action stage of change. As such, her goal is to continue with weight loss efforts She has agreed to change to keep a food journal with 1200 calories and 85 grams of protein daily Anavi has been instructed to work up to a goal of 150 minutes of combined cardio and strengthening exercise per week for weight loss and overall health benefits. We discussed the following Behavioral Modification Strategies today: increasing lean protein intake and planning for success   Jacoya has agreed to follow up with our clinic in 4 weeks. She was informed of the importance of frequent follow up visits to maximize her success with intensive lifestyle modifications for her multiple health conditions.  I, Trixie Dredge, am acting as transcriptionist for Lacy Duverney, PA-C  I have reviewed the above documentation for accuracy and completeness, and I agree with the above. -Lacy Duverney, PA-C  I have reviewed the above note and agree with the plan. -Dennard Nip, MD     Today's visit was # 22 out of 22.  Starting weight: 205 lbs Starting date: 10/09/16 Today's weight : 185 lbs  Today's date: 09/24/2017 Total lbs lost to date: 20 (Patients must lose 7 lbs in the first 6 months to continue with counseling)   ASK: We discussed the diagnosis of obesity with [name] today and Caniyah agreed to give [name] permission to discuss obesity behavioral modification therapy today.  ASSESS: Pari has the diagnosis of obesity and her BMI today is 32.78 Sangeeta is in the action stage of  change   ADVISE: Taniqua was educated on the multiple health risks of obesity as well as the  benefit of weight loss to improve her health. She was advised of the need for long term treatment and the importance of lifestyle modifications.  AGREE: Multiple dietary modification options and treatment options were discussed and  Aevah agreed to keep a food journal with 1200 calories and 85 grams of protein daily We discussed the following Behavioral Modification Strategies today: increasing lean protein intake and planning for success

## 2017-10-20 ENCOUNTER — Ambulatory Visit (INDEPENDENT_AMBULATORY_CARE_PROVIDER_SITE_OTHER): Payer: 59 | Admitting: Physician Assistant

## 2017-10-20 ENCOUNTER — Encounter (INDEPENDENT_AMBULATORY_CARE_PROVIDER_SITE_OTHER): Payer: Self-pay

## 2017-12-01 DIAGNOSIS — J0100 Acute maxillary sinusitis, unspecified: Secondary | ICD-10-CM | POA: Diagnosis not present

## 2017-12-01 MED FILL — BROMIPHENIR-PSEUDOEPHED-DM: 30-2-10 | 5 days supply | Qty: 180 | Fill #0

## 2017-12-01 MED FILL — IPRATROPIUM 0.06% SPRAY: 0.06 | 10 days supply | Qty: 15 | Fill #0

## 2017-12-01 MED FILL — DOXYCYCLINE HYCLATE 100 MG: 100 | 10 days supply | Qty: 20 | Fill #0

## 2017-12-16 DIAGNOSIS — J069 Acute upper respiratory infection, unspecified: Secondary | ICD-10-CM | POA: Diagnosis not present

## 2017-12-17 MED FILL — HYDROCODONE-HOMATROPINE SYR: 5-1.5 | 6 days supply | Qty: 120 | Fill #0

## 2017-12-17 MED FILL — AZITHROMYCIN 250 MG TABLET: 250 | 5 days supply | Qty: 6 | Fill #0

## 2018-02-17 ENCOUNTER — Ambulatory Visit (INDEPENDENT_AMBULATORY_CARE_PROVIDER_SITE_OTHER): Payer: 59 | Admitting: Physician Assistant

## 2018-02-17 VITALS — BP 125/80 | HR 78 | Temp 98.2°F | Ht 63.0 in | Wt 190.0 lb

## 2018-02-17 DIAGNOSIS — I10 Essential (primary) hypertension: Secondary | ICD-10-CM | POA: Diagnosis not present

## 2018-02-17 DIAGNOSIS — E7849 Other hyperlipidemia: Secondary | ICD-10-CM | POA: Diagnosis not present

## 2018-02-17 DIAGNOSIS — Z6833 Body mass index (BMI) 33.0-33.9, adult: Secondary | ICD-10-CM | POA: Diagnosis not present

## 2018-02-17 DIAGNOSIS — Z9189 Other specified personal risk factors, not elsewhere classified: Secondary | ICD-10-CM | POA: Diagnosis not present

## 2018-02-17 DIAGNOSIS — E8881 Metabolic syndrome: Secondary | ICD-10-CM

## 2018-02-17 DIAGNOSIS — E559 Vitamin D deficiency, unspecified: Secondary | ICD-10-CM | POA: Diagnosis not present

## 2018-02-17 DIAGNOSIS — E669 Obesity, unspecified: Secondary | ICD-10-CM

## 2018-02-17 MED ORDER — METFORMIN HCL 500 MG PO TABS: 500.0000 mg | ORAL_TABLET | Freq: Two times a day (BID) | ORAL | 0 refills | Status: DC

## 2018-02-17 MED ORDER — VITAMIN D (ERGOCALCIFEROL) 1.25 MG (50000 UNIT) PO CAPS: 50000.0000 [IU] | ORAL_CAPSULE | ORAL | 0 refills | Status: DC

## 2018-02-17 MED ORDER — HYDROCHLOROTHIAZIDE 12.5 MG PO TABS: 12.5000 mg | ORAL_TABLET | Freq: Every day | ORAL | 0 refills | Status: DC

## 2018-02-17 MED FILL — HYDROCHLOROTHIAZIDE 12.5 MG: 12.5 | 30 days supply | Qty: 30 | Fill #0

## 2018-02-17 MED FILL — metFORMIN HCL 500 MG TABS: 500 | 30 days supply | Qty: 60 | Fill #0

## 2018-02-17 MED FILL — VIT D2 1.25 MG (50,000 UNIT: 1.25 MG | 28 days supply | Qty: 4 | Fill #0

## 2018-02-17 NOTE — Progress Notes (Signed)
Office: [phone]  /  Fax: [phone]   HPI:   Chief Complaint: OBESITY Kristi Huynh is here to discuss her progress with her obesity treatment plan. She is on the keep a food journal with 1200 calories and 85 grams of protein daily and is following her eating plan approximately 80 % of the time. She states she is walking for 60 minutes 3 times per week. Kristi Huynh's last follow up with our clinic on 09/24/17. She states she has had family stress and has not been as mindful of her eating. She is motivated to get back on track and continue with weight loss.  Her weight is 190 lb (86.2 kg) today and has gained 5 pounds since her last visit. She has lost 15 lbs since starting treatment with [name].  Hypertension Kristi Huynh is a 52 y.o. female with hypertension. Kristi Huynh's blood pressure is stable and she denies chest pain or shortness of breath. She is working weight loss to help control her blood pressure with the goal of decreasing her risk of heart attack and stroke. Kristi Huynh's blood pressure is currently controlled.  Hyperlipidemia Kristi Huynh has hyperlipidemia and has been trying to improve her cholesterol levels with intensive lifestyle modification including a low saturated fat diet, exercise and weight loss. She denies any chest pain, claudication or myalgias. She is not on statin and she declines today  At risk for cardiovascular disease Kristi Huynh is at a higher than average risk for cardiovascular disease due to obesity, hypertension, and hyperlipidemia. She currently denies any chest pain.  Insulin Resistance Kristi Huynh has a diagnosis of insulin resistance based on her elevated fasting insulin level >5. Although Kristi Huynh's blood glucose readings are still under good control, insulin resistance puts her at greater risk of metabolic syndrome and diabetes. She is taking metformin currently and continues to work on diet and exercise to decrease risk of diabetes. She denies polyphagia.  Vitamin D Deficiency Kristi Huynh  has a diagnosis of vitamin D deficiency. She is currently taking prescription Vit D and denies nausea, vomiting or muscle weakness.  ALLERGIES: Allergies  Allergen Reactions  . Codeine   . Oxycodone Hcl   . Penicillins   . Sulfonamide Derivatives     MEDICATIONS: Current Outpatient Medications on File Prior to Visit  Medication Sig Dispense Refill  . Ascorbic Acid (VITA-C PO) Take 2 tablets by mouth every morning.    . Loratadine-Pseudoephedrine (HCA ALLERGY/NASAL DECONGESTANT PO) Take 1 tablet by mouth every morning.    . Melatonin 10 MG TABS Take 1 tablet by mouth at bedtime.    . Misc Natural Products (ENERGY FOCUS) TABS Take 1 tablet by mouth every morning.    . Multiple Vitamin (MULTIVITAMIN) tablet Take 1 tablet by mouth every morning.    . Omega-3 Fatty Acids (FISH OIL) 1000 MG CAPS Take 1 capsule by mouth every morning.    . polyethylene glycol powder (GLYCOLAX/MIRALAX) powder Take 17 g by mouth every morning. 3350 g 0  . Probiotic Product (PROBIOTIC-10 PO) Take 1 tablet by mouth every morning.    Kristi Huynh 150 MG TABS Take 4 tablets by mouth every morning.    . vitamin B-12 (CYANOCOBALAMIN) 1000 MCG tablet Take 1,000 mcg by mouth daily.    . Wheat Dextrin (BENEFIBER) POWD Take 1 scoop by mouth 2 (two) times daily. One TBSP BID  0   No current facility-administered medications on file prior to visit.     PAST MEDICAL HISTORY: Past Medical History:  Diagnosis Date  .  Anxiety   . back pain   . Depression   . joint pain   . Swelling    feet and legs    PAST SURGICAL HISTORY: Past Surgical History:  Procedure Laterality Date  . ANKLE SURGERY     Right 2 times, once as teen and once at age 41  . CYST REMOVAL HAND     Right hand age 31 and 55  . LAPAROSCOPY     endometrosis  . TONSILLECTOMY AND ADENOIDECTOMY     52 years old    SOCIAL HISTORY: Social History   Tobacco Use  . Smoking status: Never Smoker  . Smokeless tobacco: Never Used  Substance Use  Topics  . Alcohol use: Not on file  . Drug use: Not on file    FAMILY HISTORY: Family History  Problem Relation Age of Onset  . Diabetes Mother   . Hypertension Mother   . Hyperlipidemia Mother   . Thyroid disease Mother   . Depression Mother   . Anxiety disorder Mother   . Eating disorder Mother   . Obesity Mother   . Heart disease Father   . Sudden death Father     ROS: Review of Systems  Constitutional: Negative for weight loss.  Respiratory: Negative for shortness of breath.   Cardiovascular: Negative for chest pain and claudication.  Gastrointestinal: Negative for nausea and vomiting.  Musculoskeletal: Negative for myalgias.       Negative muscle weakness  Endo/Heme/Allergies:       Negative polyphagia    PHYSICAL EXAM: Blood pressure 125/80, pulse 78, temperature 98.2 F (36.8 C), temperature source Oral, height 5\' 3"  (1.6 m), weight 190 lb (86.2 kg), SpO2 99 %. Body mass index is 33.66 kg/m. Physical Exam  Constitutional: She is oriented to person, place, and time. She appears well-developed and well-nourished.  Cardiovascular: Normal rate.  Pulmonary/Chest: Effort normal.  Musculoskeletal: Normal range of motion.  Neurological: She is oriented to person, place, and time.  Skin: Skin is warm and dry.  Psychiatric: She has a normal mood and affect. Her behavior is normal.  Vitals reviewed.   RECENT LABS AND TESTS: BMET    Component Value Date/Time   NA 140 03/11/2017 0816   K 3.8 03/11/2017 0816   CL 97 03/11/2017 0816   CO2 26 03/11/2017 0816   GLUCOSE 101 (H) 03/11/2017 0816   GLUCOSE 93 12/03/2009 2056   BUN 13 03/11/2017 0816   CREATININE 0.74 03/11/2017 0816   CALCIUM 9.5 03/11/2017 0816   GFRNONAA 93 03/11/2017 0816   GFRAA 108 03/11/2017 0816   Lab Results  Component Value Date   HGBA1C 5.6 03/11/2017   HGBA1C 5.4 10/09/2016   Lab Results  Component Value Date   INSULIN 21.3 03/11/2017   INSULIN 14.4 10/09/2016   CBC      Component Value Date/Time   WBC 8.8 10/09/2016 1442   WBC 6.2 10/24/2008 2117   RBC 4.84 10/09/2016 1442   RBC 4.64 10/24/2008 2117   HGB 14.4 10/09/2016 1442   HCT 42.4 10/09/2016 1442   PLT 252 10/24/2008 2117   MCV 88 10/09/2016 1442   MCH 29.8 10/09/2016 1442   MCHC 34.0 10/09/2016 1442   MCHC 32.6 10/24/2008 2117   RDW 13.7 10/09/2016 1442   LYMPHSABS 2.8 10/09/2016 1442   EOSABS 0.1 10/09/2016 1442   BASOSABS 0.1 10/09/2016 1442   Iron/TIBC/Ferritin/ %Sat No results found for: IRON, TIBC, FERRITIN, IRONPCTSAT Lipid Panel     Component Value  Date/Time   CHOL 181 03/11/2017 0816   TRIG 224 (H) 03/11/2017 0816   HDL 37 (L) 03/11/2017 0816   CHOLHDL 4.7 Ratio 12/03/2009 2056   VLDL 27 12/03/2009 2056   LDLCALC 99 03/11/2017 0816   Hepatic Function Panel     Component Value Date/Time   PROT 7.6 03/11/2017 0816   ALBUMIN 4.5 03/11/2017 0816   AST 27 03/11/2017 0816   ALT 19 03/11/2017 0816   ALKPHOS 69 03/11/2017 0816   BILITOT 0.6 03/11/2017 0816      Component Value Date/Time   TSH 1.970 10/09/2016 1442   TSH 1.990 12/03/2009 2056   TSH 2.142 10/24/2008 2117  Results for SALIHAH, PECKHAM (MRN [id number]) as of 02/17/2018 12:52  Ref. Range 03/11/2017 08:16  Vitamin D, 25-Hydroxy Latest Ref Range: 30.0 - 100.0 ng/mL 44.4    ASSESSMENT AND PLAN: Essential hypertension - Plan: hydrochlorothiazide (HYDRODIURIL) 12.5 MG tablet  Vitamin D deficiency - Plan: Vitamin D, Ergocalciferol, (DRISDOL) 50000 units CAPS capsule  Insulin resistance - Plan: metFORMIN (GLUCOPHAGE) 500 MG tablet  Other hyperlipidemia  At risk for heart disease  Class 1 obesity with serious comorbidity and body mass index (BMI) of 33.0 to 33.9 in adult, unspecified obesity type  PLAN:  Hypertension We discussed sodium restriction, working on healthy weight loss, and a regular exercise program as the means to achieve improved blood pressure control. Kristi Huynh agreed with this plan and agreed to  follow up as directed. We will continue to monitor her blood pressure as well as her progress with the above lifestyle modifications. Litsy agrees to continues taking hydrochlorothiazide 12.5 mg qd #30 and we will refill for 1 month. She will watch for signs of hypotension as she continues her lifestyle modifications. Eva agrees to follow up with our clinic in 2 weeks.  Hyperlipidemia Kristi Huynh was informed of the American Heart Association Guidelines emphasizing intensive lifestyle modifications as the first line treatment for hyperlipidemia. We discussed many lifestyle modifications today in depth, and Kristi Huynh will continue to work on decreasing saturated fats such as fatty red meat, butter and many fried foods. She will also increase vegetables and lean protein in her diet and continue to work on diet, exercise, and weight loss efforts.  Cardiovascular risk counselling Kristi Huynh was given extended (15 minutes) coronary artery disease prevention counseling today. She is 52 y.o. female and has risk factors for heart disease including obesity, hypertension, and hyperlipidemia. We discussed intensive lifestyle modifications today with an emphasis on specific weight loss instructions and strategies. Pt was also informed of the importance of increasing exercise and decreasing saturated fats to help prevent heart disease.  Insulin Resistance Kristi Huynh will continue to work on weight loss, exercise, and decreasing simple carbohydrates in her diet to help decrease the risk of diabetes. We dicussed metformin including benefits and risks. She was informed that eating too many simple carbohydrates or too many calories at one sitting increases the likelihood of GI side effects. Kristi Huynh agrees to continue taking metformin 500 mg BID #60 and we will refill for 1 month. Kristi Huynh agrees to follow up with our clinic in 2 weeks as directed to monitor her progress.  Vitamin D Deficiency Kristi Huynh was informed that low vitamin D levels  contributes to fatigue and are associated with obesity, breast, and colon cancer. Kristi Huynh agrees to continue taking prescription Vit D @50 ,000 IU every week #4 and we will refill for 1 month. She will follow up for routine testing of vitamin D, at least 2-3 times  per year. She was informed of the risk of over-replacement of vitamin D and agrees to not increase her dose unless she discusses this with [name] first. Zayneb agrees to follow up with our clinic in 2 weeks.  Obesity Shynice is currently in the action stage of change. As such, her goal is to continue with weight loss efforts She has agreed to follow the Category 2 plan Lakiya has been instructed to work up to a goal of 150 minutes of combined cardio and strengthening exercise per week for weight loss and overall health benefits. We discussed the following Behavioral Modification Strategies today: increasing lean protein intake and work on meal planning and easy cooking plans   Latausha has agreed to follow up with our clinic in 2 weeks. She was informed of the importance of frequent follow up visits to maximize her success with intensive lifestyle modifications for her multiple health conditions.   Wilhemena Durie, am acting as transcriptionist for Lacy Duverney, PA-C I, Lacy Duverney [facility], have reviewed this note and agree with its content

## 2018-02-26 DIAGNOSIS — L821 Other seborrheic keratosis: Secondary | ICD-10-CM | POA: Diagnosis not present

## 2018-02-26 DIAGNOSIS — L820 Inflamed seborrheic keratosis: Secondary | ICD-10-CM | POA: Diagnosis not present

## 2018-02-26 DIAGNOSIS — B078 Other viral warts: Secondary | ICD-10-CM | POA: Diagnosis not present

## 2018-02-26 DIAGNOSIS — L739 Follicular disorder, unspecified: Secondary | ICD-10-CM | POA: Diagnosis not present

## 2018-02-26 DIAGNOSIS — D485 Neoplasm of uncertain behavior of skin: Secondary | ICD-10-CM | POA: Diagnosis not present

## 2018-02-26 DIAGNOSIS — D1801 Hemangioma of skin and subcutaneous tissue: Secondary | ICD-10-CM | POA: Diagnosis not present

## 2018-03-03 ENCOUNTER — Ambulatory Visit (INDEPENDENT_AMBULATORY_CARE_PROVIDER_SITE_OTHER): Payer: 59 | Admitting: Physician Assistant

## 2018-03-03 VITALS — BP 115/73 | HR 82 | Temp 98.1°F | Ht 63.0 in | Wt 189.0 lb

## 2018-03-03 DIAGNOSIS — Z6833 Body mass index (BMI) 33.0-33.9, adult: Secondary | ICD-10-CM | POA: Diagnosis not present

## 2018-03-03 DIAGNOSIS — E669 Obesity, unspecified: Secondary | ICD-10-CM

## 2018-03-03 DIAGNOSIS — E559 Vitamin D deficiency, unspecified: Secondary | ICD-10-CM | POA: Diagnosis not present

## 2018-03-03 NOTE — Progress Notes (Signed)
Office: [phone]  /  Fax: [phone]   HPI:   Chief Complaint: OBESITY Kristi Huynh is here to discuss her progress with her obesity treatment plan. She is on the Category 2 plan and is following her eating plan approximately 75 % of the time. She states she is walking for 60 minutes 3 times per week. Kristi Huynh continues to do well with weight loss. She is back following the meal plan more closely.  Her weight is 189 lb (85.7 kg) today and has had a weight loss of 1 pound over a period of 2 weeks since her last visit. She has lost 16 lbs since starting treatment with [name].  Vitamin D Deficiency Kristi Huynh has a diagnosis of vitamin D deficiency. She is currently taking prescription Vit D and denies nausea, vomiting or muscle weakness.  ALLERGIES: Allergies  Allergen Reactions  . Codeine   . Oxycodone Hcl   . Penicillins   . Sulfonamide Derivatives     MEDICATIONS: Current Outpatient Medications on File Prior to Visit  Medication Sig Dispense Refill  . Ascorbic Acid (VITA-C PO) Take 2 tablets by mouth every morning.    . hydrochlorothiazide (HYDRODIURIL) 12.5 MG tablet Take 1 tablet (12.5 mg total) by mouth daily. 30 tablet 0  . Loratadine-Pseudoephedrine (HCA ALLERGY/NASAL DECONGESTANT PO) Take 1 tablet by mouth every morning.    . Melatonin 10 MG TABS Take 1 tablet by mouth at bedtime.    . metFORMIN (GLUCOPHAGE) 500 MG tablet Take 1 tablet (500 mg total) by mouth 2 (two) times daily with a meal. 60 tablet 0  . Misc Natural Products (ENERGY FOCUS) TABS Take 1 tablet by mouth every morning.    . Multiple Vitamin (MULTIVITAMIN) tablet Take 1 tablet by mouth every morning.    . Omega-3 Fatty Acids (FISH OIL) 1000 MG CAPS Take 2 capsules by mouth every morning.     . polyethylene glycol powder (GLYCOLAX/MIRALAX) powder Take 17 g by mouth every morning. 3350 g 0  . Probiotic Product (PROBIOTIC-10 PO) Take 1 tablet by mouth every morning.    Kristi Huynh 150 MG TABS Take 4 tablets by mouth  every morning.    . vitamin B-12 (CYANOCOBALAMIN) 1000 MCG tablet Take 1,000 mcg by mouth daily.    . Vitamin D, Ergocalciferol, (DRISDOL) 50000 units CAPS capsule Take 1 capsule (50,000 Units total) by mouth every 7 (seven) days. 4 capsule 0   No current facility-administered medications on file prior to visit.     PAST MEDICAL HISTORY: Past Medical History:  Diagnosis Date  . Anxiety   . back pain   . Depression   . joint pain   . Swelling    feet and legs    PAST SURGICAL HISTORY: Past Surgical History:  Procedure Laterality Date  . ANKLE SURGERY     Right 2 times, once as teen and once at age 1  . CYST REMOVAL HAND     Right hand age 37 and 49  . LAPAROSCOPY     endometrosis  . TONSILLECTOMY AND ADENOIDECTOMY     52 years old    SOCIAL HISTORY: Social History   Tobacco Use  . Smoking status: Never Smoker  . Smokeless tobacco: Never Used  Substance Use Topics  . Alcohol use: Not on file  . Drug use: Not on file    FAMILY HISTORY: Family History  Problem Relation Age of Onset  . Diabetes Mother   . Hypertension Mother   . Hyperlipidemia Mother   .  Thyroid disease Mother   . Depression Mother   . Anxiety disorder Mother   . Eating disorder Mother   . Obesity Mother   . Heart disease Father   . Sudden death Father     ROS: Review of Systems  Constitutional: Positive for weight loss.  Gastrointestinal: Negative for nausea and vomiting.  Musculoskeletal:       Negative muscle weakness    PHYSICAL EXAM: Blood pressure 115/73, pulse 82, temperature 98.1 F (36.7 C), temperature source Oral, height 5\' 3"  (1.6 m), weight 189 lb (85.7 kg), SpO2 98 %. Body mass index is 33.48 kg/m. Physical Exam  Constitutional: She is oriented to person, place, and time. She appears well-developed and well-nourished.  Cardiovascular: Normal rate.  Pulmonary/Chest: Effort normal.  Musculoskeletal: Normal range of motion.  Neurological: She is oriented to person,  place, and time.  Skin: Skin is warm and dry.  Psychiatric: She has a normal mood and affect. Her behavior is normal.  Vitals reviewed.   RECENT LABS AND TESTS: BMET    Component Value Date/Time   NA 140 03/11/2017 0816   K 3.8 03/11/2017 0816   CL 97 03/11/2017 0816   CO2 26 03/11/2017 0816   GLUCOSE 101 (H) 03/11/2017 0816   GLUCOSE 93 12/03/2009 2056   BUN 13 03/11/2017 0816   CREATININE 0.74 03/11/2017 0816   CALCIUM 9.5 03/11/2017 0816   GFRNONAA 93 03/11/2017 0816   GFRAA 108 03/11/2017 0816   Lab Results  Component Value Date   HGBA1C 5.6 03/11/2017   HGBA1C 5.4 10/09/2016   Lab Results  Component Value Date   INSULIN 21.3 03/11/2017   INSULIN 14.4 10/09/2016   CBC    Component Value Date/Time   WBC 8.8 10/09/2016 1442   WBC 6.2 10/24/2008 2117   RBC 4.84 10/09/2016 1442   RBC 4.64 10/24/2008 2117   HGB 14.4 10/09/2016 1442   HCT 42.4 10/09/2016 1442   PLT 252 10/24/2008 2117   MCV 88 10/09/2016 1442   MCH 29.8 10/09/2016 1442   MCHC 34.0 10/09/2016 1442   MCHC 32.6 10/24/2008 2117   RDW 13.7 10/09/2016 1442   LYMPHSABS 2.8 10/09/2016 1442   EOSABS 0.1 10/09/2016 1442   BASOSABS 0.1 10/09/2016 1442   Iron/TIBC/Ferritin/ %Sat No results found for: IRON, TIBC, FERRITIN, IRONPCTSAT Lipid Panel     Component Value Date/Time   CHOL 181 03/11/2017 0816   TRIG 224 (H) 03/11/2017 0816   HDL 37 (L) 03/11/2017 0816   CHOLHDL 4.7 Ratio 12/03/2009 2056   VLDL 27 12/03/2009 2056   LDLCALC 99 03/11/2017 0816   Hepatic Function Panel     Component Value Date/Time   PROT 7.6 03/11/2017 0816   ALBUMIN 4.5 03/11/2017 0816   AST 27 03/11/2017 0816   ALT 19 03/11/2017 0816   ALKPHOS 69 03/11/2017 0816   BILITOT 0.6 03/11/2017 0816      Component Value Date/Time   TSH 1.970 10/09/2016 1442   TSH 1.990 12/03/2009 2056   TSH 2.142 10/24/2008 2117  Results for Kristi Huynh, IDLEMAN (MRN [id number]) as of 03/03/2018 12:05  Ref. Range 03/11/2017 08:16  Vitamin D,  25-Hydroxy Latest Ref Range: 30.0 - 100.0 ng/mL 44.4    ASSESSMENT AND PLAN: Vitamin D deficiency  Class 1 obesity with serious comorbidity and body mass index (BMI) of 33.0 to 33.9 in adult, unspecified obesity type  PLAN:  Vitamin D Deficiency Kristi Huynh was informed that low vitamin D levels contributes to fatigue and are associated  with obesity, breast, and colon cancer. Kristi Huynh agrees to continue taking prescription Vit D @50 ,000 IU every week and will follow up for routine testing of vitamin D, at least 2-3 times per year. She was informed of the risk of over-replacement of vitamin D and agrees to not increase her dose unless she discusses this with [name] first. Kristi Huynh agrees to follow up with our clinic in 3 weeks.  We spent > than 50% of the 15 minute visit on the counseling as documented in the note.  Obesity Kristi Huynh is currently in the action stage of change. As such, her goal is to continue with weight loss efforts She has agreed to follow the Category 2 plan Kristi Huynh has been instructed to work up to a goal of 150 minutes of combined cardio and strengthening exercise per week for weight loss and overall health benefits. We discussed the following Behavioral Modification Strategies today: increasing lean protein intake and work on meal planning and easy cooking plans   Kristi Huynh has agreed to follow up with our clinic in 3 weeks. She was informed of the importance of frequent follow up visits to maximize her success with intensive lifestyle modifications for her multiple health conditions.   Kristi Huynh, am acting as transcriptionist for Lacy Duverney, PA-C I, Lacy Duverney [facility], have reviewed this note and agree with its content

## 2018-03-24 ENCOUNTER — Ambulatory Visit (INDEPENDENT_AMBULATORY_CARE_PROVIDER_SITE_OTHER): Payer: 59 | Admitting: Physician Assistant

## 2018-03-24 VITALS — BP 128/75 | HR 79 | Temp 99.8°F | Ht 63.0 in | Wt 190.0 lb

## 2018-03-24 DIAGNOSIS — Z6833 Body mass index (BMI) 33.0-33.9, adult: Secondary | ICD-10-CM

## 2018-03-24 DIAGNOSIS — E559 Vitamin D deficiency, unspecified: Secondary | ICD-10-CM

## 2018-03-24 DIAGNOSIS — Z9189 Other specified personal risk factors, not elsewhere classified: Secondary | ICD-10-CM | POA: Diagnosis not present

## 2018-03-24 DIAGNOSIS — E669 Obesity, unspecified: Secondary | ICD-10-CM | POA: Diagnosis not present

## 2018-03-24 DIAGNOSIS — R7303 Prediabetes: Secondary | ICD-10-CM | POA: Diagnosis not present

## 2018-03-24 DIAGNOSIS — I10 Essential (primary) hypertension: Secondary | ICD-10-CM

## 2018-03-24 MED ORDER — VITAMIN D (ERGOCALCIFEROL) 1.25 MG (50000 UNIT) PO CAPS: 50000.0000 [IU] | ORAL_CAPSULE | ORAL | 0 refills | Status: DC

## 2018-03-24 MED ORDER — HYDROCHLOROTHIAZIDE 12.5 MG PO TABS: 12.5000 mg | ORAL_TABLET | Freq: Every day | ORAL | 0 refills | Status: DC

## 2018-03-24 MED ORDER — METFORMIN HCL 500 MG PO TABS: 500.0000 mg | ORAL_TABLET | Freq: Two times a day (BID) | ORAL | 0 refills | Status: DC

## 2018-03-24 MED FILL — HYDROCHLOROTHIAZIDE 12.5 MG: 12.5 | 30 days supply | Qty: 30 | Fill #0

## 2018-03-24 MED FILL — metFORMIN HCL 500 MG TABS: 500 | 30 days supply | Qty: 60 | Fill #0

## 2018-03-24 MED FILL — VIT D2 1.25 MG (50,000 UNIT: 1.25 MG | 28 days supply | Qty: 4 | Fill #0

## 2018-03-24 NOTE — Progress Notes (Signed)
Office: [phone]  /  Fax: [phone]   HPI:   Chief Complaint: OBESITY Kristi Huynh is here to discuss her progress with her obesity treatment plan. She is on the Category 2 plan and is following her eating plan approximately 75 % of the time. She states she is walking for 45 minutes 3 times per week. Kristi Huynh is retaining fluids. She continues to have challenges eating the required protein.  Her weight is 190 lb (86.2 kg) today and has gained 1 pound since her last visit. She has lost 15 lbs since starting treatment with [name].  Hypertension [name] is a 52 y.o. female with hypertension. [name]'s blood pressure is stable and she denies chest pain or shortness of breath. She is working weight loss to help control her blood pressure with the goal of decreasing her risk of heart attack and stroke. [name]'s blood pressure is currently controlled.  At risk for cardiovascular disease [name] is at a higher than average risk for cardiovascular disease due to obesity and hypertension. She currently denies any chest pain.  Vitamin D Deficiency Kristi Huynh has a diagnosis of vitamin D deficiency. She is currently taking prescription Vit D and denies nausea, vomiting or muscle weakness.  Pre-Diabetes Kristi Huynh has a diagnosis of pre-diabetes based on her elevated Hgb A1c and was informed this puts her at greater risk of developing diabetes. She is taking metformin currently and continues to work on diet and exercise to decrease risk of diabetes. She denies polyphagia, nausea, or hypoglycemia.  ALLERGIES: Allergies  Allergen Reactions  . Codeine   . Oxycodone Hcl   . Penicillins   . Sulfonamide Derivatives     MEDICATIONS: Current Outpatient Medications on File Prior to Visit  Medication Sig Dispense Refill  . Ascorbic Acid (VITA-C PO) Take 2 tablets by mouth every morning.    . hydrochlorothiazide (HYDRODIURIL) 12.5 MG tablet Take 1 tablet (12.5 mg total) by mouth daily. 30 tablet 0  .  Loratadine-Pseudoephedrine (HCA ALLERGY/NASAL DECONGESTANT PO) Take 1 tablet by mouth every morning.    . Melatonin 10 MG TABS Take 1 tablet by mouth at bedtime.    . metFORMIN (GLUCOPHAGE) 500 MG tablet Take 1 tablet (500 mg total) by mouth 2 (two) times daily with a meal. 60 tablet 0  . Misc Natural Products (ENERGY FOCUS) TABS Take 1 tablet by mouth every morning.    . Multiple Vitamin (MULTIVITAMIN) tablet Take 1 tablet by mouth every morning.    . Omega-3 Fatty Acids (FISH OIL) 1000 MG CAPS Take 2 capsules by mouth every morning.     . polyethylene glycol powder (GLYCOLAX/MIRALAX) powder Take 17 g by mouth every morning. 3350 g 0  . Probiotic Product (PROBIOTIC-10 PO) Take 1 tablet by mouth every morning.    [name] 150 MG TABS Take 4 tablets by mouth every morning.    . vitamin B-12 (CYANOCOBALAMIN) 1000 MCG tablet Take 1,000 mcg by mouth daily.    . Vitamin D, Ergocalciferol, (DRISDOL) 50000 units CAPS capsule Take 1 capsule (50,000 Units total) by mouth every 7 (seven) days. 4 capsule 0   No current facility-administered medications on file prior to visit.     PAST MEDICAL HISTORY: Past Medical History:  Diagnosis Date  . Anxiety   . back pain   . Depression   . joint pain   . Swelling    feet and legs    PAST SURGICAL HISTORY: Past Surgical History:  Procedure Laterality Date  . ANKLE  SURGERY     Right 2 times, once as teen and once at age 7  . CYST REMOVAL HAND     Right hand age 43 and 42  . LAPAROSCOPY     endometrosis  . TONSILLECTOMY AND ADENOIDECTOMY     52 years old    SOCIAL HISTORY: Social History   Tobacco Use  . Smoking status: Never Smoker  . Smokeless tobacco: Never Used  Substance Use Topics  . Alcohol use: Not on file  . Drug use: Not on file    FAMILY HISTORY: Family History  Problem Relation Age of Onset  . Diabetes Mother   . Hypertension Mother   . Hyperlipidemia Mother   . Thyroid disease Mother   . Depression Mother   .  Anxiety disorder Mother   . Eating disorder Mother   . Obesity Mother   . Heart disease Father   . Sudden death Father     ROS: Review of Systems  Constitutional: Negative for weight loss.  Respiratory: Negative for shortness of breath.   Cardiovascular: Negative for chest pain.  Gastrointestinal: Negative for nausea and vomiting.  Musculoskeletal:       Negative muscle weakness  Endo/Heme/Allergies:       Negative polyphagia Negative hypoglycemia    PHYSICAL EXAM: Blood pressure 128/75, pulse 79, temperature 99.8 F (37.7 C), temperature source Oral, height 5\' 3"  (1.6 m), weight 190 lb (86.2 kg), SpO2 99 %. Body mass index is 33.66 kg/m. Physical Exam  Constitutional: She is oriented to person, place, and time. She appears well-developed and well-nourished.  Cardiovascular: Normal rate.  Pulmonary/Chest: Effort normal.  Musculoskeletal: Normal range of motion.  Neurological: She is oriented to person, place, and time.  Skin: Skin is warm and dry.  Psychiatric: She has a normal mood and affect. Her behavior is normal.  Vitals reviewed.   RECENT LABS AND TESTS: BMET    Component Value Date/Time   NA 140 03/11/2017 0816   K 3.8 03/11/2017 0816   CL 97 03/11/2017 0816   CO2 26 03/11/2017 0816   GLUCOSE 101 (H) 03/11/2017 0816   GLUCOSE 93 12/03/2009 2056   BUN 13 03/11/2017 0816   CREATININE 0.74 03/11/2017 0816   CALCIUM 9.5 03/11/2017 0816   GFRNONAA 93 03/11/2017 0816   GFRAA 108 03/11/2017 0816   Lab Results  Component Value Date   HGBA1C 5.6 03/11/2017   HGBA1C 5.4 10/09/2016   Lab Results  Component Value Date   INSULIN 21.3 03/11/2017   INSULIN 14.4 10/09/2016   CBC    Component Value Date/Time   WBC 8.8 10/09/2016 1442   WBC 6.2 10/24/2008 2117   RBC 4.84 10/09/2016 1442   RBC 4.64 10/24/2008 2117   HGB 14.4 10/09/2016 1442   HCT 42.4 10/09/2016 1442   PLT 252 10/24/2008 2117   MCV 88 10/09/2016 1442   MCH 29.8 10/09/2016 1442   MCHC 34.0  10/09/2016 1442   MCHC 32.6 10/24/2008 2117   RDW 13.7 10/09/2016 1442   LYMPHSABS 2.8 10/09/2016 1442   EOSABS 0.1 10/09/2016 1442   BASOSABS 0.1 10/09/2016 1442   Iron/TIBC/Ferritin/ %Sat No results found for: IRON, TIBC, FERRITIN, IRONPCTSAT Lipid Panel     Component Value Date/Time   CHOL 181 03/11/2017 0816   TRIG 224 (H) 03/11/2017 0816   HDL 37 (L) 03/11/2017 0816   CHOLHDL 4.7 Ratio 12/03/2009 2056   VLDL 27 12/03/2009 2056   LDLCALC 99 03/11/2017 0816   Hepatic Function Panel  Component Value Date/Time   PROT 7.6 03/11/2017 0816   ALBUMIN 4.5 03/11/2017 0816   AST 27 03/11/2017 0816   ALT 19 03/11/2017 0816   ALKPHOS 69 03/11/2017 0816   BILITOT 0.6 03/11/2017 0816      Component Value Date/Time   TSH 1.970 10/09/2016 1442   TSH 1.990 12/03/2009 2056   TSH 2.142 10/24/2008 2117  Results for JEVAEH, SHAMS (MRN [id number]) as of 03/24/2018 13:09  Ref. Range 03/11/2017 08:16  Vitamin D, 25-Hydroxy Latest Ref Range: 30.0 - 100.0 ng/mL 44.4    ASSESSMENT AND PLAN: Essential hypertension - Plan: hydrochlorothiazide (HYDRODIURIL) 12.5 MG tablet  Vitamin D deficiency - Plan: Vitamin D, Ergocalciferol, (DRISDOL) 50000 units CAPS capsule  Prediabetes  At risk for heart disease  Class 1 obesity with serious comorbidity and body mass index (BMI) of 33.0 to 33.9 in adult, unspecified obesity type  PLAN:  Hypertension We discussed sodium restriction, working on healthy weight loss, and a regular exercise program as the means to achieve improved blood pressure control. Oneka agreed with this plan and agreed to follow up as directed. We will continue to monitor her blood pressure as well as her progress with the above lifestyle modifications. Senie agrees to continue taking hydrochlorothiazide 12.5 mg qd #30 and we will refill for 1 month. She will watch for signs of hypotension as she continues her lifestyle modifications. Devonna agrees to follow up with our clinic  in 2 to 3 weeks.  Cardiovascular risk counselling Zoey was given extended (15 minutes) coronary artery disease prevention counseling today. She is 52 y.o. female and has risk factors for heart disease including obesity and hypertension. We discussed intensive lifestyle modifications today with an emphasis on specific weight loss instructions and strategies. Pt was also informed of the importance of increasing exercise and decreasing saturated fats to help prevent heart disease.  Vitamin D Deficiency Mckynleigh was informed that low vitamin D levels contributes to fatigue and are associated with obesity, breast, and colon cancer. Annely agrees to continue taking prescription Vit D @50 ,000 IU every week #4 and we will refill for 1 month. She will follow up for routine testing of vitamin D, at least 2-3 times per year. She was informed of the risk of over-replacement of vitamin D and agrees to not increase her dose unless she discusses this with [name] first. Chava agrees to follow up with our clinic in 2 to 3 weeks.  Pre-Diabetes Vina will continue to work on weight loss, exercise, and decreasing simple carbohydrates in her diet to help decrease the risk of diabetes. We dicussed metformin including benefits and risks. She was informed that eating too many simple carbohydrates or too many calories at one sitting increases the likelihood of GI side effects. Shelonda agrees to continue taking metformin 500 mg BID #60 and we will refill for 1 month. Madeliene agrees to follow up with our clinic in 2 to 3 weeks as directed to monitor her progress.  Obesity Karman is currently in the action stage of change. As such, her goal is to continue with weight loss efforts She has agreed to follow the Category 2 plan Chana has been instructed to work up to a goal of 150 minutes of combined cardio and strengthening exercise per week for weight loss and overall health benefits. We discussed the following Behavioral Modification  Strategies today: increasing lean protein intake and work on meal planning and easy cooking plans   Kaylor has agreed to follow up with our  clinic in 2 to 3 weeks. She was informed of the importance of frequent follow up visits to maximize her success with intensive lifestyle modifications for her multiple health conditions.   Wilhemena Durie, am acting as transcriptionist for Lacy Duverney, PA-C I, Lacy Duverney [facility], have reviewed this note and agree with its content

## 2018-03-30 DIAGNOSIS — R600 Localized edema: Secondary | ICD-10-CM | POA: Diagnosis not present

## 2018-03-30 DIAGNOSIS — E669 Obesity, unspecified: Secondary | ICD-10-CM | POA: Diagnosis not present

## 2018-03-30 DIAGNOSIS — Z1211 Encounter for screening for malignant neoplasm of colon: Secondary | ICD-10-CM | POA: Diagnosis not present

## 2018-03-30 DIAGNOSIS — Z6834 Body mass index (BMI) 34.0-34.9, adult: Secondary | ICD-10-CM | POA: Diagnosis not present

## 2018-04-14 ENCOUNTER — Ambulatory Visit (INDEPENDENT_AMBULATORY_CARE_PROVIDER_SITE_OTHER): Payer: 59 | Admitting: Physician Assistant

## 2018-04-14 VITALS — BP 127/78 | HR 87 | Temp 98.5°F | Ht 63.0 in | Wt 188.0 lb

## 2018-04-14 DIAGNOSIS — E669 Obesity, unspecified: Secondary | ICD-10-CM | POA: Diagnosis not present

## 2018-04-14 DIAGNOSIS — Z9189 Other specified personal risk factors, not elsewhere classified: Secondary | ICD-10-CM | POA: Diagnosis not present

## 2018-04-14 DIAGNOSIS — E559 Vitamin D deficiency, unspecified: Secondary | ICD-10-CM

## 2018-04-14 DIAGNOSIS — R7303 Prediabetes: Secondary | ICD-10-CM

## 2018-04-14 DIAGNOSIS — E7849 Other hyperlipidemia: Secondary | ICD-10-CM

## 2018-04-14 DIAGNOSIS — Z6833 Body mass index (BMI) 33.0-33.9, adult: Secondary | ICD-10-CM

## 2018-04-14 MED ORDER — METFORMIN HCL 500 MG PO TABS: 500.0000 mg | ORAL_TABLET | Freq: Two times a day (BID) | ORAL | 0 refills | Status: DC

## 2018-04-14 MED ORDER — VITAMIN D (ERGOCALCIFEROL) 1.25 MG (50000 UNIT) PO CAPS: 50000.0000 [IU] | ORAL_CAPSULE | ORAL | 0 refills | Status: DC

## 2018-04-14 NOTE — Progress Notes (Signed)
Office: [phone]  /  Fax: [phone]   HPI:   Chief Complaint: OBESITY Kristi Huynh is here to discuss her progress with her obesity treatment plan. She is on the Category 2 plan and is following her eating plan approximately 75 % of the time. She states she is walking for 45 minutes 3-4 times per week. Kristi Huynh continues to do well with weight loss. She is mindful of her eating and controls her portions.  Her weight is 188 lb (85.3 kg) today and has had a weight loss of 2 pounds over a period of 3 weeks since her last visit. She has lost 17 lbs since starting treatment with [name].  Hyperlipidemia Kristi Huynh has hyperlipidemia and has been trying to improve her cholesterol levels with intensive lifestyle modification including a low saturated fat diet, exercise and weight loss. She is not on medications, declines. She denies any chest pain, claudication or myalgias.  At risk for cardiovascular disease Kristi Huynh is at a higher than average risk for cardiovascular disease due to obesity and hyperlipidemia. She currently denies any chest pain.  Vitamin D Deficiency Kristi Huynh has a diagnosis of vitamin D deficiency. She is currently taking prescription Vit D and denies nausea, vomiting or muscle weakness.  Pre-Diabetes Kristi Huynh has a diagnosis of pre-diabetes based on her elevated Hgb A1c and was informed this puts her at greater risk of developing diabetes. She is taking metformin currently and continues to work on diet and exercise to decrease risk of diabetes. She denies polyphagia, nausea, or hypoglycemia.  ALLERGIES: Allergies  Allergen Reactions  . Codeine   . Oxycodone Hcl   . Penicillins   . Sulfonamide Derivatives     MEDICATIONS: Current Outpatient Medications on File Prior to Visit  Medication Sig Dispense Refill  . Ascorbic Acid (VITA-C PO) Take 2 tablets by mouth every morning.    . hydrochlorothiazide (HYDRODIURIL) 12.5 MG tablet Take 1 tablet (12.5 mg total) by mouth daily. 30 tablet 0  .  Loratadine-Pseudoephedrine (HCA ALLERGY/NASAL DECONGESTANT PO) Take 1 tablet by mouth every morning.    . Melatonin 10 MG TABS Take 1 tablet by mouth at bedtime.    . Misc Natural Products (ENERGY FOCUS) TABS Take 1 tablet by mouth every morning.    . Multiple Vitamin (MULTIVITAMIN) tablet Take 1 tablet by mouth every morning.    . Omega-3 Fatty Acids (FISH OIL) 1000 MG CAPS Take 2 capsules by mouth every morning.     . polyethylene glycol powder (GLYCOLAX/MIRALAX) powder Take 17 g by mouth every morning. 3350 g 0  . Probiotic Product (PROBIOTIC-10 PO) Take 1 tablet by mouth every morning.    [name] 150 MG TABS Take 4 tablets by mouth every morning.    . vitamin B-12 (CYANOCOBALAMIN) 1000 MCG tablet Take 1,000 mcg by mouth daily.     No current facility-administered medications on file prior to visit.     PAST MEDICAL HISTORY: Past Medical History:  Diagnosis Date  . Anxiety   . back pain   . Depression   . joint pain   . Swelling    feet and legs    PAST SURGICAL HISTORY: Past Surgical History:  Procedure Laterality Date  . ANKLE SURGERY     Right 2 times, once as teen and once at age 86  . CYST REMOVAL HAND     Right hand age 7 and 66  . LAPAROSCOPY     endometrosis  . TONSILLECTOMY AND ADENOIDECTOMY  52 years old    SOCIAL HISTORY: Social History   Tobacco Use  . Smoking status: Never Smoker  . Smokeless tobacco: Never Used  Substance Use Topics  . Alcohol use: Not on file  . Drug use: Not on file    FAMILY HISTORY: Family History  Problem Relation Age of Onset  . Diabetes Mother   . Hypertension Mother   . Hyperlipidemia Mother   . Thyroid disease Mother   . Depression Mother   . Anxiety disorder Mother   . Eating disorder Mother   . Obesity Mother   . Heart disease Father   . Sudden death Father     ROS: Review of Systems  Constitutional: Positive for weight loss.  Cardiovascular: Negative for chest pain and claudication.    Gastrointestinal: Negative for nausea and vomiting.  Musculoskeletal: Negative for myalgias.       Negative muscle weakness  Endo/Heme/Allergies:       Negative polyphagia Negative hypoglycemia    PHYSICAL EXAM: Blood pressure 127/78, pulse 87, temperature 98.5 F (36.9 C), temperature source Oral, height 5\' 3"  (1.6 m), weight 188 lb (85.3 kg), SpO2 98 %. Body mass index is 33.3 kg/m. Physical Exam  Constitutional: She is oriented to person, place, and time. She appears well-developed and well-nourished.  Cardiovascular: Normal rate.  Pulmonary/Chest: Effort normal.  Musculoskeletal: Normal range of motion.  Neurological: She is oriented to person, place, and time.  Skin: Skin is warm and dry.  Psychiatric: She has a normal mood and affect. Her behavior is normal.  Vitals reviewed.   RECENT LABS AND TESTS: BMET    Component Value Date/Time   NA 140 03/11/2017 0816   K 3.8 03/11/2017 0816   CL 97 03/11/2017 0816   CO2 26 03/11/2017 0816   GLUCOSE 101 (H) 03/11/2017 0816   GLUCOSE 93 12/03/2009 2056   BUN 13 03/11/2017 0816   CREATININE 0.74 03/11/2017 0816   CALCIUM 9.5 03/11/2017 0816   GFRNONAA 93 03/11/2017 0816   GFRAA 108 03/11/2017 0816   Lab Results  Component Value Date   HGBA1C 5.6 03/11/2017   HGBA1C 5.4 10/09/2016   Lab Results  Component Value Date   INSULIN 21.3 03/11/2017   INSULIN 14.4 10/09/2016   CBC    Component Value Date/Time   WBC 8.8 10/09/2016 1442   WBC 6.2 10/24/2008 2117   RBC 4.84 10/09/2016 1442   RBC 4.64 10/24/2008 2117   HGB 14.4 10/09/2016 1442   HCT 42.4 10/09/2016 1442   PLT 252 10/24/2008 2117   MCV 88 10/09/2016 1442   MCH 29.8 10/09/2016 1442   MCHC 34.0 10/09/2016 1442   MCHC 32.6 10/24/2008 2117   RDW 13.7 10/09/2016 1442   LYMPHSABS 2.8 10/09/2016 1442   EOSABS 0.1 10/09/2016 1442   BASOSABS 0.1 10/09/2016 1442   Iron/TIBC/Ferritin/ %Sat No results found for: IRON, TIBC, FERRITIN, IRONPCTSAT Lipid Panel      Component Value Date/Time   CHOL 181 03/11/2017 0816   TRIG 224 (H) 03/11/2017 0816   HDL 37 (L) 03/11/2017 0816   CHOLHDL 4.7 Ratio 12/03/2009 2056   VLDL 27 12/03/2009 2056   LDLCALC 99 03/11/2017 0816   Hepatic Function Panel     Component Value Date/Time   PROT 7.6 03/11/2017 0816   ALBUMIN 4.5 03/11/2017 0816   AST 27 03/11/2017 0816   ALT 19 03/11/2017 0816   ALKPHOS 69 03/11/2017 0816   BILITOT 0.6 03/11/2017 0816      Component Value  Date/Time   TSH 1.970 10/09/2016 1442   TSH 1.990 12/03/2009 2056   TSH 2.142 10/24/2008 2117  Results for ARMIYAH, CAPRON (MRN [id number]) as of 04/14/2018 13:13  Ref. Range 03/11/2017 08:16  Vitamin D, 25-Hydroxy Latest Ref Range: 30.0 - 100.0 ng/mL 44.4    ASSESSMENT AND PLAN: Other hyperlipidemia - Plan: Comprehensive metabolic panel, Lipid panel  Vitamin D deficiency - Plan: VITAMIN D 25 Hydroxy (Vit-D Deficiency, Fractures), Vitamin D, Ergocalciferol, (DRISDOL) 50000 units CAPS capsule  Prediabetes - Plan: Hemoglobin A1c, Insulin, random, metFORMIN (GLUCOPHAGE) 500 MG tablet  At risk for heart disease  Class 1 obesity with serious comorbidity and body mass index (BMI) of 33.0 to 33.9 in adult, unspecified obesity type  PLAN:  Hyperlipidemia Kristi Huynh was informed of the American Heart Association Guidelines emphasizing intensive lifestyle modifications as the first line treatment for hyperlipidemia. We discussed many lifestyle modifications today in depth, and Kristi Huynh will continue to work on decreasing saturated fats such as fatty red meat, butter and many fried foods. She will also increase vegetables and lean protein in her diet and continue to work on diet, exercise, and weight loss efforts. We will check labs and Tula agrees to follow up with our clinic in 2 weeks.  Cardiovascular risk counselling Kristi Huynh was given extended (15 minutes) coronary artery disease prevention counseling today. She is 52 y.o. female and has risk  factors for heart disease including obesity and hyperlipidemia. We discussed intensive lifestyle modifications today with an emphasis on specific weight loss instructions and strategies. Pt was also informed of the importance of increasing exercise and decreasing saturated fats to help prevent heart disease.  Vitamin D Deficiency Kristi Huynh was informed that low vitamin D levels contributes to fatigue and are associated with obesity, breast, and colon cancer. Kristi Huynh agrees to continue taking prescription Vit D @50 ,000 IU every week #4 and we will refill for 1 month. She will follow up for routine testing of vitamin D, at least 2-3 times per year. She was informed of the risk of over-replacement of vitamin D and agrees to not increase her dose unless she discusses this with [name] first. We will check labs and Kristi Huynh agrees to follow up with our clinic in 2 weeks.  Pre-Diabetes Kristi Huynh will continue to work on weight loss, exercise, and decreasing simple carbohydrates in her diet to help decrease the risk of diabetes. We dicussed metformin including benefits and risks. She was informed that eating too many simple carbohydrates or too many calories at one sitting increases the likelihood of GI side effects. Kristi Huynh agrees to continue taking metformin 500 mg BID #60 and we will refill for 1 month. We will check labs and Kristi Huynh agrees to follow up with our clinic in 2 weeks as directed to monitor her progress.  Obesity Kristi Huynh is currently in the action stage of change. As such, her goal is to continue with weight loss efforts She has agreed to follow the Category 2 plan Kristi Huynh has been instructed to work up to a goal of 150 minutes of combined cardio and strengthening exercise per week for weight loss and overall health benefits. We discussed the following Behavioral Modification Strategies today: increasing lean protein intake and work on meal planning and easy cooking plans   Kristi Huynh has agreed to follow up with our  clinic in 2 weeks. She was informed of the importance of frequent follow up visits to maximize her success with intensive lifestyle modifications for her multiple health conditions.   Clide Dales  Hassell Done, am acting as Location manager for [employer], [place], Lacy Duverney [facility], have reviewed this note and agree with its content

## 2018-04-16 MED FILL — VIT D2 1.25 MG (50,000 UNIT: 1.25 MG | 28 days supply | Qty: 4 | Fill #0

## 2018-04-19 MED FILL — metFORMIN HCL 500 MG TABS: 500 | 30 days supply | Qty: 60 | Fill #0

## 2018-04-21 DIAGNOSIS — R7303 Prediabetes: Secondary | ICD-10-CM | POA: Diagnosis not present

## 2018-04-21 DIAGNOSIS — E7849 Other hyperlipidemia: Secondary | ICD-10-CM | POA: Diagnosis not present

## 2018-04-21 DIAGNOSIS — E559 Vitamin D deficiency, unspecified: Secondary | ICD-10-CM | POA: Diagnosis not present

## 2018-04-22 LAB — LIPID PANEL
Chol/HDL Ratio: 5.2 ratio — ABNORMAL HIGH (ref 0.0–4.4)
Cholesterol, Total: 186 mg/dL (ref 100–199)
HDL: 36 mg/dL — AB (ref >39)
LDL Calculated: 110 mg/dL — ABNORMAL HIGH (ref 0–99)
TRIGLYCERIDES: 202 mg/dL — AB (ref 0–149)
VLDL Cholesterol Cal: 40 mg/dL (ref 5–40)

## 2018-04-22 LAB — COMPREHENSIVE METABOLIC PANEL
A/G RATIO: 1.6 (ref 1.2–2.2)
ALT: 14 IU/L (ref 0–32)
AST: 20 IU/L (ref 0–40)
Albumin: 4.4 g/dL (ref 3.5–5.5)
Alkaline Phosphatase: 60 IU/L (ref 39–117)
BUN/Creatinine Ratio: 13 (ref 9–23)
BUN: 10 mg/dL (ref 6–24)
Bilirubin Total: 0.5 mg/dL (ref 0.0–1.2)
CO2: 27 mmol/L (ref 20–29)
CREATININE: 0.8 mg/dL (ref 0.57–1.00)
Calcium: 9.4 mg/dL (ref 8.7–10.2)
Chloride: 100 mmol/L (ref 96–106)
GFR, EST AFRICAN AMERICAN: 97 mL/min/{1.73_m2} (ref >59)
GFR, EST NON AFRICAN AMERICAN: 84 mL/min/{1.73_m2} (ref >59)
GLOBULIN, TOTAL: 2.8 g/dL (ref 1.5–4.5)
Glucose: 94 mg/dL (ref 65–99)
POTASSIUM: 4 mmol/L (ref 3.5–5.2)
Sodium: 142 mmol/L (ref 134–144)
TOTAL PROTEIN: 7.2 g/dL (ref 6.0–8.5)

## 2018-04-22 LAB — HEMOGLOBIN A1C
Est. average glucose Bld gHb Est-mCnc: 111 mg/dL
Hgb A1c MFr Bld: 5.5 % (ref 4.8–5.6)

## 2018-04-22 LAB — VITAMIN D 25 HYDROXY (VIT D DEFICIENCY, FRACTURES): VIT D 25 HYDROXY: 31.5 ng/mL (ref 30.0–100.0)

## 2018-04-22 LAB — INSULIN, RANDOM: INSULIN: 18.9 u[IU]/mL (ref 2.6–24.9)

## 2018-04-29 ENCOUNTER — Ambulatory Visit (INDEPENDENT_AMBULATORY_CARE_PROVIDER_SITE_OTHER): Payer: 59 | Admitting: Physician Assistant

## 2018-04-29 VITALS — BP 107/69 | HR 83 | Temp 98.3°F | Ht 63.0 in | Wt 189.0 lb

## 2018-04-29 DIAGNOSIS — I10 Essential (primary) hypertension: Secondary | ICD-10-CM | POA: Diagnosis not present

## 2018-04-29 DIAGNOSIS — Z6833 Body mass index (BMI) 33.0-33.9, adult: Secondary | ICD-10-CM

## 2018-04-29 DIAGNOSIS — E559 Vitamin D deficiency, unspecified: Secondary | ICD-10-CM

## 2018-04-29 DIAGNOSIS — E669 Obesity, unspecified: Secondary | ICD-10-CM

## 2018-04-29 DIAGNOSIS — Z9189 Other specified personal risk factors, not elsewhere classified: Secondary | ICD-10-CM

## 2018-04-29 MED ORDER — HYDROCHLOROTHIAZIDE 12.5 MG PO TABS: 12.5000 mg | ORAL_TABLET | Freq: Every day | ORAL | 0 refills | Status: DC

## 2018-04-29 MED FILL — HYDROCHLOROTHIAZIDE 12.5 MG: 12.5 | 30 days supply | Qty: 30 | Fill #0

## 2018-04-29 NOTE — Progress Notes (Signed)
Office: [phone]  /  Fax: [phone]   HPI:   Chief Complaint: OBESITY Olene is here to discuss her progress with her obesity treatment plan. She is on the Category 2 plan and is following her eating plan approximately 50 % of the time. She states she is walking the stairs for 15 minutes 3 times per week. Kamylle states she has not been as mindful of her eating and does not get all the recommended protein.  Her weight is 189 lb (85.7 kg) today and has gained 1 pound since her last visit. She has lost 16 lbs since starting treatment with [name].  Hypertension DALARY HOLLAR is a 52 y.o. female with hypertension. Annlouise's blood pressure is stable, low normal. She is advised to check blood pressure at home and keep a log. She denies chest pain or shortness of breath. Has noticed increase in fatigue but admits she is not sleeping as well. She is working weight loss to help control her blood pressure with the goal of decreasing her risk of heart attack and stroke. Xzaria's blood pressure is currently controlled.  At risk for cardiovascular disease Dian is at a higher than average risk for cardiovascular disease due to obesity and hypertension. She currently denies any chest pain.  Vitamin D Deficiency Syeda has a diagnosis of vitamin D deficiency. She is currently taking prescription Vit D, Vit D level not yet at goal. She denies nausea, vomiting or muscle weakness.  ALLERGIES: Allergies  Allergen Reactions  . Codeine   . Oxycodone Hcl   . Penicillins   . Sulfonamide Derivatives     MEDICATIONS: Current Outpatient Medications on File Prior to Visit  Medication Sig Dispense Refill  . Ascorbic Acid (VITA-C PO) Take 2 tablets by mouth every morning.    . Loratadine-Pseudoephedrine (HCA ALLERGY/NASAL DECONGESTANT PO) Take 1 tablet by mouth every morning.    . Melatonin 10 MG TABS Take 1 tablet by mouth at bedtime.    . metFORMIN (GLUCOPHAGE) 500 MG tablet Take 1 tablet (500 mg total) by  mouth 2 (two) times daily with a meal. 60 tablet 0  . Misc Natural Products (ENERGY FOCUS) TABS Take 1 tablet by mouth every morning.    . Multiple Vitamin (MULTIVITAMIN) tablet Take 1 tablet by mouth every morning.    . Omega-3 Fatty Acids (FISH OIL) 1000 MG CAPS Take 2 capsules by mouth every morning.     . polyethylene glycol powder (GLYCOLAX/MIRALAX) powder Take 17 g by mouth every morning. 3350 g 0  . Probiotic Product (PROBIOTIC-10 PO) Take 1 tablet by mouth every morning.    [name] 150 MG TABS Take 4 tablets by mouth every morning.    . vitamin B-12 (CYANOCOBALAMIN) 1000 MCG tablet Take 1,000 mcg by mouth daily.    . Vitamin D, Ergocalciferol, (DRISDOL) 50000 units CAPS capsule Take 1 capsule (50,000 Units total) by mouth every 7 (seven) days. 4 capsule 0   No current facility-administered medications on file prior to visit.     PAST MEDICAL HISTORY: Past Medical History:  Diagnosis Date  . Anxiety   . back pain   . Depression   . joint pain   . Swelling    feet and legs    PAST SURGICAL HISTORY: Past Surgical History:  Procedure Laterality Date  . ANKLE SURGERY     Right 2 times, once as teen and once at age 24  . CYST REMOVAL HAND     Right hand  age 2 and 44  . LAPAROSCOPY     endometrosis  . TONSILLECTOMY AND ADENOIDECTOMY     52 years old    SOCIAL HISTORY: Social History   Tobacco Use  . Smoking status: Never Smoker  . Smokeless tobacco: Never Used  Substance Use Topics  . Alcohol use: Not on file  . Drug use: Not on file    FAMILY HISTORY: Family History  Problem Relation Age of Onset  . Diabetes Mother   . Hypertension Mother   . Hyperlipidemia Mother   . Thyroid disease Mother   . Depression Mother   . Anxiety disorder Mother   . Eating disorder Mother   . Obesity Mother   . Heart disease Father   . Sudden death Father     ROS: Review of Systems  Constitutional: Negative for weight loss.  Respiratory: Negative for shortness of  breath.   Cardiovascular: Negative for chest pain.  Gastrointestinal: Negative for nausea and vomiting.  Musculoskeletal:       Negative muscle weakness    PHYSICAL EXAM: Blood pressure 107/69, pulse 83, temperature 98.3 F (36.8 C), temperature source Oral, height 5\' 3"  (1.6 m), weight 189 lb (85.7 kg), SpO2 98 %. Body mass index is 33.48 kg/m. Physical Exam  Constitutional: She is oriented to person, place, and time. She appears well-developed and well-nourished.  Cardiovascular: Normal rate.  Pulmonary/Chest: Effort normal.  Musculoskeletal: Normal range of motion.  Neurological: She is oriented to person, place, and time.  Skin: Skin is warm and dry.  Psychiatric: She has a normal mood and affect. Her behavior is normal.  Vitals reviewed.   RECENT LABS AND TESTS: BMET    Component Value Date/Time   NA 142 04/21/2018 0733   K 4.0 04/21/2018 0733   CL 100 04/21/2018 0733   CO2 27 04/21/2018 0733   GLUCOSE 94 04/21/2018 0733   GLUCOSE 93 12/03/2009 2056   BUN 10 04/21/2018 0733   CREATININE 0.80 04/21/2018 0733   CALCIUM 9.4 04/21/2018 0733   GFRNONAA 84 04/21/2018 0733   GFRAA 97 04/21/2018 0733   Lab Results  Component Value Date   HGBA1C 5.5 04/21/2018   HGBA1C 5.6 03/11/2017   HGBA1C 5.4 10/09/2016   Lab Results  Component Value Date   INSULIN 18.9 04/21/2018   INSULIN 21.3 03/11/2017   INSULIN 14.4 10/09/2016   CBC    Component Value Date/Time   WBC 8.8 10/09/2016 1442   WBC 6.2 10/24/2008 2117   RBC 4.84 10/09/2016 1442   RBC 4.64 10/24/2008 2117   HGB 14.4 10/09/2016 1442   HCT 42.4 10/09/2016 1442   PLT 252 10/24/2008 2117   MCV 88 10/09/2016 1442   MCH 29.8 10/09/2016 1442   MCHC 34.0 10/09/2016 1442   MCHC 32.6 10/24/2008 2117   RDW 13.7 10/09/2016 1442   LYMPHSABS 2.8 10/09/2016 1442   EOSABS 0.1 10/09/2016 1442   BASOSABS 0.1 10/09/2016 1442   Iron/TIBC/Ferritin/ %Sat No results found for: IRON, TIBC, FERRITIN, IRONPCTSAT Lipid  Panel     Component Value Date/Time   CHOL 186 04/21/2018 0733   TRIG 202 (H) 04/21/2018 0733   HDL 36 (L) 04/21/2018 0733   CHOLHDL 5.2 (H) 04/21/2018 0733   CHOLHDL 4.7 Ratio 12/03/2009 2056   VLDL 27 12/03/2009 2056   LDLCALC 110 (H) 04/21/2018 0733   Hepatic Function Panel     Component Value Date/Time   PROT 7.2 04/21/2018 0733   ALBUMIN 4.4 04/21/2018 0733   AST  20 04/21/2018 0733   ALT 14 04/21/2018 0733   ALKPHOS 60 04/21/2018 0733   BILITOT 0.5 04/21/2018 0733      Component Value Date/Time   TSH 1.970 10/09/2016 1442   TSH 1.990 12/03/2009 2056   TSH 2.142 10/24/2008 2117  Results for AURIELLE, [place] (MRN [id number]) as of 04/29/2018 14:42  Ref. Range 04/21/2018 07:33  Vitamin D, 25-Hydroxy Latest Ref Range: 30.0 - 100.0 ng/mL 31.5    ASSESSMENT AND PLAN: Essential hypertension - Plan: hydrochlorothiazide (HYDRODIURIL) 12.5 MG tablet  Vitamin D deficiency  At risk for heart disease  Class 1 obesity with serious comorbidity and body mass index (BMI) of 33.0 to 33.9 in adult, unspecified obesity type  PLAN:  Hypertension We discussed sodium restriction, working on healthy weight loss, and a regular exercise program as the means to achieve improved blood pressure control. Ardath agreed with this plan and agreed to follow up as directed. We will continue to monitor her blood pressure as well as her progress with the above lifestyle modifications. Priti agrees to continue taking hydrochlorothiazide 12.5 mg qd #30 and we will refill for 1 month. She will watch for signs of hypotension as she continues her lifestyle modifications. Samiyyah agrees to follow up with our clinic in 3 weeks.  Cardiovascular risk counselling Dorothey was given extended (15 minutes) coronary artery disease prevention counseling today. She is 52 y.o. female and has risk factors for heart disease including obesity and hypertension. We discussed intensive lifestyle modifications today with an emphasis  on specific weight loss instructions and strategies. Pt was also informed of the importance of increasing exercise and decreasing saturated fats to help prevent heart disease.  Vitamin D Deficiency Andre was informed that low vitamin D levels contributes to fatigue and are associated with obesity, breast, and colon cancer. Dominigue agrees to continue taking prescription Vit D @50 ,000 IU every week and will follow up for routine testing of vitamin D, at least 2-3 times per year. She was informed of the risk of over-replacement of vitamin D and agrees to not increase her dose unless she discusses this with [name] first. Sierria agrees to follow up with our clinic in 3 weeks.  Obesity  Flannery is currently in the action stage of change. As such, her goal is to get back to weightloss efforts  She has agreed to follow the Category 2 plan Deitra has been instructed to work up to a goal of 150 minutes of combined cardio and strengthening exercise per week for weight loss and overall health benefits. We discussed the following Behavioral Modification Strategies today: work on meal planning and easy cooking plans and planning for success   Aqueelah has agreed to follow up with our clinic in 3 weeks. She was informed of the importance of frequent follow up visits to maximize her success with intensive lifestyle modifications for her multiple health conditions.   Wilhemena Durie, am acting as transcriptionist for Lacy Duverney, PA-C I, Lacy Duverney [facility], have reviewed this note and agree with its content

## 2018-05-19 ENCOUNTER — Ambulatory Visit (INDEPENDENT_AMBULATORY_CARE_PROVIDER_SITE_OTHER): Payer: 59 | Admitting: Physician Assistant

## 2018-05-19 VITALS — BP 129/72 | HR 86 | Temp 98.9°F | Ht 63.0 in | Wt 190.0 lb

## 2018-05-19 DIAGNOSIS — Z6833 Body mass index (BMI) 33.0-33.9, adult: Secondary | ICD-10-CM

## 2018-05-19 DIAGNOSIS — Z9189 Other specified personal risk factors, not elsewhere classified: Secondary | ICD-10-CM | POA: Diagnosis not present

## 2018-05-19 DIAGNOSIS — E559 Vitamin D deficiency, unspecified: Secondary | ICD-10-CM | POA: Diagnosis not present

## 2018-05-19 DIAGNOSIS — I10 Essential (primary) hypertension: Secondary | ICD-10-CM | POA: Diagnosis not present

## 2018-05-19 DIAGNOSIS — E669 Obesity, unspecified: Secondary | ICD-10-CM

## 2018-05-19 MED ORDER — VITAMIN D (ERGOCALCIFEROL) 1.25 MG (50000 UNIT) PO CAPS: 50000.0000 [IU] | ORAL_CAPSULE | ORAL | 0 refills | Status: DC

## 2018-05-19 MED FILL — VIT D2 1.25 MG (50,000 UNIT: 1.25 MG | 28 days supply | Qty: 4 | Fill #0

## 2018-05-20 NOTE — Progress Notes (Signed)
Office: [phone]  /  Fax: [phone]   HPI:   Chief Complaint: OBESITY Kristi Huynh is here to discuss her progress with her obesity treatment plan. She is on the Category 2 plan and is following her eating plan approximately 60 % of the time. She states she is exercising 0 minutes 0 times per week. Kristi Huynh reports struggling with her plan due to family stress and busy schedule. She states that she is bored with Category 2 and would like to go back to [place].  Her weight is 190 lb (86.2 kg) today and has gained 1 pound since her last visit. She has lost 15 lbs since starting treatment with [name].  Vitamin D Deficiency Kristi Huynh has a diagnosis of vitamin D deficiency. She is on prescription Vit D, last level not at goal. She denies nausea, vomiting or muscle weakness.  At risk for osteopenia and osteoporosis Kristi Huynh is at higher risk of osteopenia and osteoporosis due to vitamin D deficiency.   Hypertension Kristi Huynh is a 52 y.o. female with hypertension. Kristi Huynh's blood pressure is controlled today. Hydrochlorothiazide working well and she reports that swelling in ankles has decreased. She denies chest pain. She is working weight loss to help control her blood pressure with the goal of decreasing her risk of heart attack and stroke.   ALLERGIES: Allergies  Allergen Reactions  . Codeine   . Oxycodone Hcl   . Penicillins   . Sulfonamide Derivatives     MEDICATIONS: Current Outpatient Medications on File Prior to Visit  Medication Sig Dispense Refill  . Ascorbic Acid (VITA-C PO) Take 2 tablets by mouth every morning.    . hydrochlorothiazide (HYDRODIURIL) 12.5 MG tablet Take 1 tablet (12.5 mg total) by mouth daily. 30 tablet 0  . Loratadine-Pseudoephedrine (HCA ALLERGY/NASAL DECONGESTANT PO) Take 1 tablet by mouth every morning.    . Melatonin 10 MG TABS Take 1 tablet by mouth at bedtime.    . metFORMIN (GLUCOPHAGE) 500 MG tablet Take 1 tablet (500 mg total) by mouth 2 (two) times  daily with a meal. 60 tablet 0  . Misc Natural Products (ENERGY FOCUS) TABS Take 1 tablet by mouth every morning.    . Multiple Vitamin (MULTIVITAMIN) tablet Take 1 tablet by mouth every morning.    . Omega-3 Fatty Acids (FISH OIL) 1000 MG CAPS Take 2 capsules by mouth every morning.     . polyethylene glycol powder (GLYCOLAX/MIRALAX) powder Take 17 g by mouth every morning. 3350 g 0  . Probiotic Product (PROBIOTIC-10 PO) Take 1 tablet by mouth every morning.    [name] 150 MG TABS Take 4 tablets by mouth every morning.    . vitamin B-12 (CYANOCOBALAMIN) 1000 MCG tablet Take 1,000 mcg by mouth daily.     No current facility-administered medications on file prior to visit.     PAST MEDICAL HISTORY: Past Medical History:  Diagnosis Date  . Anxiety   . back pain   . Depression   . joint pain   . Swelling    feet and legs    PAST SURGICAL HISTORY: Past Surgical History:  Procedure Laterality Date  . ANKLE SURGERY     Right 2 times, once as teen and once at age 16  . CYST REMOVAL HAND     Right hand age 23 and 73  . LAPAROSCOPY     endometrosis  . TONSILLECTOMY AND ADENOIDECTOMY     52 years old    SOCIAL HISTORY: Social History  Tobacco Use  . Smoking status: Never Smoker  . Smokeless tobacco: Never Used  Substance Use Topics  . Alcohol use: Not on file  . Drug use: Not on file    FAMILY HISTORY: Family History  Problem Relation Age of Onset  . Diabetes Mother   . Hypertension Mother   . Hyperlipidemia Mother   . Thyroid disease Mother   . Depression Mother   . Anxiety disorder Mother   . Eating disorder Mother   . Obesity Mother   . Heart disease Father   . Sudden death Father     ROS: Review of Systems  Constitutional: Negative for weight loss.  Cardiovascular: Negative for chest pain.  Gastrointestinal: Negative for nausea and vomiting.  Musculoskeletal:       Negative muscle weakness    PHYSICAL EXAM: Blood pressure 129/72, pulse 86,  temperature 98.9 F (37.2 C), temperature source Oral, height 5\' 3"  (1.6 m), weight 190 lb (86.2 kg), SpO2 97 %. Body mass index is 33.66 kg/m. Physical Exam  Constitutional: She is oriented to person, place, and time. She appears well-developed and well-nourished.  Cardiovascular: Normal rate.  Pulmonary/Chest: Effort normal.  Musculoskeletal: Normal range of motion.  Neurological: She is oriented to person, place, and time.  Skin: Skin is warm and dry.  Psychiatric: She has a normal mood and affect. Her behavior is normal.  Vitals reviewed.   RECENT LABS AND TESTS: BMET    Component Value Date/Time   NA 142 04/21/2018 0733   K 4.0 04/21/2018 0733   CL 100 04/21/2018 0733   CO2 27 04/21/2018 0733   GLUCOSE 94 04/21/2018 0733   GLUCOSE 93 12/03/2009 2056   BUN 10 04/21/2018 0733   CREATININE 0.80 04/21/2018 0733   CALCIUM 9.4 04/21/2018 0733   GFRNONAA 84 04/21/2018 0733   GFRAA 97 04/21/2018 0733   Lab Results  Component Value Date   HGBA1C 5.5 04/21/2018   HGBA1C 5.6 03/11/2017   HGBA1C 5.4 10/09/2016   Lab Results  Component Value Date   INSULIN 18.9 04/21/2018   INSULIN 21.3 03/11/2017   INSULIN 14.4 10/09/2016   CBC    Component Value Date/Time   WBC 8.8 10/09/2016 1442   WBC 6.2 10/24/2008 2117   RBC 4.84 10/09/2016 1442   RBC 4.64 10/24/2008 2117   HGB 14.4 10/09/2016 1442   HCT 42.4 10/09/2016 1442   PLT 252 10/24/2008 2117   MCV 88 10/09/2016 1442   MCH 29.8 10/09/2016 1442   MCHC 34.0 10/09/2016 1442   MCHC 32.6 10/24/2008 2117   RDW 13.7 10/09/2016 1442   LYMPHSABS 2.8 10/09/2016 1442   EOSABS 0.1 10/09/2016 1442   BASOSABS 0.1 10/09/2016 1442   Iron/TIBC/Ferritin/ %Sat No results found for: IRON, TIBC, FERRITIN, IRONPCTSAT Lipid Panel     Component Value Date/Time   CHOL 186 04/21/2018 0733   TRIG 202 (H) 04/21/2018 0733   HDL 36 (L) 04/21/2018 0733   CHOLHDL 5.2 (H) 04/21/2018 0733   CHOLHDL 4.7 Ratio 12/03/2009 2056   VLDL 27  12/03/2009 2056   LDLCALC 110 (H) 04/21/2018 0733   Hepatic Function Panel     Component Value Date/Time   PROT 7.2 04/21/2018 0733   ALBUMIN 4.4 04/21/2018 0733   AST 20 04/21/2018 0733   ALT 14 04/21/2018 0733   ALKPHOS 60 04/21/2018 0733   BILITOT 0.5 04/21/2018 0733      Component Value Date/Time   TSH 1.970 10/09/2016 1442   TSH 1.990 12/03/2009 2056  TSH 2.142 10/24/2008 2117  Results for MICAYLAH, BERTUCCI (MRN [id number]) as of 05/20/2018 15:14  Ref. Range 04/21/2018 07:33  Vitamin D, 25-Hydroxy Latest Ref Range: 30.0 - 100.0 ng/mL 31.5    ASSESSMENT AND PLAN: Vitamin D deficiency - Plan: Vitamin D, Ergocalciferol, (DRISDOL) 50000 units CAPS capsule  Essential hypertension  At risk for osteoporosis  Class 1 obesity with serious comorbidity and body mass index (BMI) of 33.0 to 33.9 in adult, unspecified obesity type  PLAN:  Vitamin D Deficiency Kamaryn was informed that low vitamin D levels contributes to fatigue and are associated with obesity, breast, and colon cancer. Ellamay agrees to continue taking prescription Vit D @50 ,000 IU every week #4 and we will refill for 1 month. She will follow up for routine testing of vitamin D, at least 2-3 times per year. She was informed of the risk of over-replacement of vitamin D and agrees to not increase her dose unless she discusses this with [name] first. Beula agrees to follow up with our clinic in 2 to 3 weeks.  At risk for osteopenia and osteoporosis Anea is at risk for osteopenia and osteoporsis due to her vitamin D deficiency. She was encouraged to take her vitamin D and follow her higher calcium diet and increase strengthening exercise to help strengthen her bones and decrease her risk of osteopenia and osteoporosis.  Hypertension We discussed sodium restriction, working on healthy weight loss, and a regular exercise program as the means to achieve improved blood pressure control. Matricia agreed with this plan and agreed to follow  up as directed. We will continue to monitor her blood pressure as well as her progress with the above lifestyle modifications. She will continue her medications, diet, exercise, and weight loss, and will watch for signs of hypotension as she continues her lifestyle modifications. Kieanna agrees to follow up with our clinic in 2 to 3 weeks.  Obesity Pedro is currently in the action stage of change. As such, her goal is to continue with weight loss efforts She has agreed to follow the Pescatarian eating plan Arlyss has been instructed to work up to a goal of 150 minutes of combined cardio and strengthening exercise per week for weight loss and overall health benefits. We discussed the following Behavioral Modification Strategies today: decreasing simple carbohydrates  and work on meal planning and easy cooking plans   Torina has agreed to follow up with our clinic in 2 to 3 weeks. She was informed of the importance of frequent follow up visits to maximize her success with intensive lifestyle modifications for her multiple health conditions.   Wilhemena Durie, am acting as transcriptionist for Abby Potash, PA-C I, Abby Potash, PA-C have reviewed above note and agree with its content

## 2018-06-09 ENCOUNTER — Ambulatory Visit (INDEPENDENT_AMBULATORY_CARE_PROVIDER_SITE_OTHER): Payer: Self-pay | Admitting: Physician Assistant

## 2018-06-10 ENCOUNTER — Ambulatory Visit (INDEPENDENT_AMBULATORY_CARE_PROVIDER_SITE_OTHER): Payer: 59 | Admitting: Physician Assistant

## 2018-06-10 VITALS — BP 115/74 | HR 81 | Temp 98.3°F | Ht 63.0 in | Wt 191.0 lb

## 2018-06-10 DIAGNOSIS — I10 Essential (primary) hypertension: Secondary | ICD-10-CM | POA: Diagnosis not present

## 2018-06-10 DIAGNOSIS — E669 Obesity, unspecified: Secondary | ICD-10-CM | POA: Diagnosis not present

## 2018-06-10 DIAGNOSIS — E8881 Metabolic syndrome: Secondary | ICD-10-CM

## 2018-06-10 DIAGNOSIS — Z9189 Other specified personal risk factors, not elsewhere classified: Secondary | ICD-10-CM | POA: Diagnosis not present

## 2018-06-10 DIAGNOSIS — Z6833 Body mass index (BMI) 33.0-33.9, adult: Secondary | ICD-10-CM

## 2018-06-10 MED ORDER — HYDROCHLOROTHIAZIDE 12.5 MG PO TABS: 12.5000 mg | ORAL_TABLET | Freq: Every day | ORAL | 0 refills | Status: DC

## 2018-06-10 MED ORDER — METFORMIN HCL 500 MG PO TABS: 500.0000 mg | ORAL_TABLET | Freq: Two times a day (BID) | ORAL | 0 refills | Status: DC

## 2018-06-10 MED FILL — metFORMIN HCL 500 MG TABS: 500 | 30 days supply | Qty: 60 | Fill #0

## 2018-06-10 MED FILL — HYDROCHLOROTHIAZIDE 12.5 MG: 12.5 | 30 days supply | Qty: 30 | Fill #0

## 2018-06-10 NOTE — Progress Notes (Signed)
Office: [phone]  /  Fax: [phone]   HPI:   Chief Complaint: OBESITY Kristi Huynh is here to discuss her progress with her obesity treatment plan. She is on the Pescatarian eating plan and is following her eating plan approximately 50 % of the time. She states she is walking 2 miles 3 to 4 times per week. Kristi Huynh struggled to follow the plan, due to stress at home and at work. She reports she is not eating enough protein throughout the day, and she has had noticeable fatigue. Her weight is 191 lb (86.6 kg) today and has not lost weight since her last visit. She has lost 14 lbs since starting treatment with [name].  Insulin Resistance Kristi Huynh has a diagnosis of insulin resistance based on her elevated fasting insulin level >5. Although Kristi Huynh's blood glucose readings are still under good control, insulin resistance puts her at greater risk of metabolic syndrome and diabetes. She is taking metformin currently and continues to work on diet and exercise to decrease risk of diabetes.  Hypertension [place] HUBERTY is a 52 y.o. female with hypertension. Her blood pressure is normal and Kristi Huynh denies chest pain. She is working weight loss to help control her blood pressure with the goal of decreasing her risk of heart attack and stroke. Tishas blood pressure is controlled.  At risk for cardiovascular disease Kristi Huynh is at a higher than average risk for cardiovascular disease due to obesity and hypertension. She currently denies any chest pain.  ALLERGIES: Allergies  Allergen Reactions  . Codeine   . Oxycodone Hcl   . Penicillins   . Sulfonamide Derivatives     MEDICATIONS: Current Outpatient Medications on File Prior to Visit  Medication Sig Dispense Refill  . Ascorbic Acid (VITA-C PO) Take 2 tablets by mouth every morning.    . Loratadine-Pseudoephedrine (HCA ALLERGY/NASAL DECONGESTANT PO) Take 1 tablet by mouth every morning.    . Melatonin 10 MG TABS Take 1 tablet by mouth at  bedtime.    . Misc Natural Products (ENERGY FOCUS) TABS Take 1 tablet by mouth every morning.    . Multiple Vitamin (MULTIVITAMIN) tablet Take 1 tablet by mouth every morning.    . Omega-3 Fatty Acids (FISH OIL) 1000 MG CAPS Take 2 capsules by mouth every morning.     . polyethylene glycol powder (GLYCOLAX/MIRALAX) powder Take 17 g by mouth every morning. 3350 g 0  . Probiotic Product (PROBIOTIC-10 PO) Take 1 tablet by mouth every morning.    Kristi Huynh [name] 150 MG TABS Take 4 tablets by mouth every morning.    . vitamin B-12 (CYANOCOBALAMIN) 1000 MCG tablet Take 1,000 mcg by mouth daily.    . Vitamin D, Ergocalciferol, (DRISDOL) 50000 units CAPS capsule Take 1 capsule (50,000 Units total) by mouth every 7 (seven) days. 4 capsule 0   No current facility-administered medications on file prior to visit.     PAST MEDICAL HISTORY: Past Medical History:  Diagnosis Date  . Anxiety   . back pain   . Depression   . joint pain   . Swelling    feet and legs    PAST SURGICAL HISTORY: Past Surgical History:  Procedure Laterality Date  . ANKLE SURGERY     Right 2 times, once as teen and once at age 33  . CYST REMOVAL HAND     Right hand age 66 and 60  . LAPAROSCOPY     endometrosis  . TONSILLECTOMY AND ADENOIDECTOMY  52 years old    SOCIAL HISTORY: Social History   Tobacco Use  . Smoking status: Never Smoker  . Smokeless tobacco: Never Used  Substance Use Topics  . Alcohol use: Not on file  . Drug use: Not on file    FAMILY HISTORY: Family History  Problem Relation Age of Onset  . Diabetes Mother   . Hypertension Mother   . Hyperlipidemia Mother   . Thyroid disease Mother   . Depression Mother   . Anxiety disorder Mother   . Eating disorder Mother   . Obesity Mother   . Heart disease Father   . Sudden death Father     ROS: Review of Systems  Constitutional: Negative for weight loss.  Cardiovascular: Negative for chest pain.  Endo/Heme/Allergies:        Negative for polyphagia    PHYSICAL EXAM: Blood pressure 115/74, pulse 81, temperature 98.3 F (36.8 C), temperature source Oral, height 5\' 3"  (1.6 m), weight 191 lb (86.6 kg), SpO2 98 %. Body mass index is 33.83 kg/m. Physical Exam  Constitutional: She is oriented to person, place, and time. She appears well-developed and well-nourished.  Cardiovascular: Normal rate.  Pulmonary/Chest: Effort normal.  Musculoskeletal: Normal range of motion.  Neurological: She is oriented to person, place, and time.  Skin: Skin is warm and dry.  Psychiatric: She has a normal mood and affect. Her behavior is normal.  Vitals reviewed.   RECENT LABS AND TESTS: BMET    Component Value Date/Time   NA 142 04/21/2018 0733   K 4.0 04/21/2018 0733   CL 100 04/21/2018 0733   CO2 27 04/21/2018 0733   GLUCOSE 94 04/21/2018 0733   GLUCOSE 93 12/03/2009 2056   BUN 10 04/21/2018 0733   CREATININE 0.80 04/21/2018 0733   CALCIUM 9.4 04/21/2018 0733   GFRNONAA 84 04/21/2018 0733   GFRAA 97 04/21/2018 0733   Lab Results  Component Value Date   HGBA1C 5.5 04/21/2018   HGBA1C 5.6 03/11/2017   HGBA1C 5.4 10/09/2016   Lab Results  Component Value Date   INSULIN 18.9 04/21/2018   INSULIN 21.3 03/11/2017   INSULIN 14.4 10/09/2016   CBC    Component Value Date/Time   WBC 8.8 10/09/2016 1442   WBC 6.2 10/24/2008 2117   RBC 4.84 10/09/2016 1442   RBC 4.64 10/24/2008 2117   HGB 14.4 10/09/2016 1442   HCT 42.4 10/09/2016 1442   PLT 252 10/24/2008 2117   MCV 88 10/09/2016 1442   MCH 29.8 10/09/2016 1442   MCHC 34.0 10/09/2016 1442   MCHC 32.6 10/24/2008 2117   RDW 13.7 10/09/2016 1442   LYMPHSABS 2.8 10/09/2016 1442   EOSABS 0.1 10/09/2016 1442   BASOSABS 0.1 10/09/2016 1442   Iron/TIBC/Ferritin/ %Sat No results found for: IRON, TIBC, FERRITIN, IRONPCTSAT Lipid Panel     Component Value Date/Time   CHOL 186 04/21/2018 0733   TRIG 202 (H) 04/21/2018 0733   HDL 36 (L) 04/21/2018 0733    CHOLHDL 5.2 (H) 04/21/2018 0733   CHOLHDL 4.7 Ratio 12/03/2009 2056   VLDL 27 12/03/2009 2056   LDLCALC 110 (H) 04/21/2018 0733   Hepatic Function Panel     Component Value Date/Time   PROT 7.2 04/21/2018 0733   ALBUMIN 4.4 04/21/2018 0733   AST 20 04/21/2018 0733   ALT 14 04/21/2018 0733   ALKPHOS 60 04/21/2018 0733   BILITOT 0.5 04/21/2018 0733      Component Value Date/Time   TSH 1.970 10/09/2016 1442  TSH 1.990 12/03/2009 2056   TSH 2.142 10/24/2008 2117   Results for ERIYANA, SWEETEN (MRN [id number]) as of 06/10/2018 16:51  Ref. Range 04/21/2018 07:33  Vitamin D, 25-Hydroxy Latest Ref Range: 30.0 - 100.0 ng/mL 31.5   ASSESSMENT AND PLAN: Essential hypertension - Plan: hydrochlorothiazide (HYDRODIURIL) 12.5 MG tablet  Prediabetes - Plan: metFORMIN (GLUCOPHAGE) 500 MG tablet  At risk for heart disease  Class 1 obesity with serious comorbidity and body mass index (BMI) of 33.0 to 33.9 in adult, unspecified obesity type  PLAN:  Insulin Resistance Briannah will continue to work on weight loss, exercise, and decreasing simple carbohydrates in her diet to help decrease the risk of diabetes. We dicussed metformin including benefits and risks. She was informed that eating too many simple carbohydrates or too many calories at one sitting increases the likelihood of GI side effects. Iolani requested metformin for now and prescription was written today for 1 month refill. Sherilee agreed to follow up with [name] as directed to monitor her progress.  Hypertension We discussed sodium restriction, working on healthy weight loss, and a regular exercise program as the means to achieve improved blood pressure control. Delsa agreed with this plan and agreed to follow up as directed. We will continue to monitor her blood pressure as well as her progress with the above lifestyle modifications. She agreed to continue HCTZ 12.5 mg qd #30 with no refills and she will watch for signs of hypotension as she  continues her lifestyle modifications.  Cardiovascular risk counseling Shylee was given extended (15 minutes) coronary artery disease prevention counseling today. She is 52 y.o. female and has risk factors for heart disease including obesity and hypertension. We discussed intensive lifestyle modifications today with an emphasis on specific weight loss instructions and strategies. Pt was also informed of the importance of increasing exercise and decreasing saturated fats to help prevent heart disease.  Obesity Keniah is currently in the action stage of change. As such, her goal is to continue with weight loss efforts She has agreed to change to the Category 2 plan Allianna has been instructed to work up to a goal of 150 minutes of combined cardio and strengthening exercise per week for weight loss and overall health benefits. We discussed the following Behavioral Modification Strategies today: increasing lean protein intake and work on meal planning and easy cooking plans  Paulette has agreed to follow up with our clinic in 2 to 3 weeks. She was informed of the importance of frequent follow up visits to maximize her success with intensive lifestyle modifications for her multiple health conditions.   OBESITY BEHAVIORAL INTERVENTION VISIT  Today's visit was # 29  Starting weight: 205 lbs Starting date: 10/09/16 Today's weight : 191 lbs  Today's date: 06/10/2018 Total lbs lost to date: 31   ASK: We discussed the diagnosis of obesity with [name] today and Atonya agreed to give [name] permission to discuss obesity behavioral modification therapy today.  ASSESS: Shariya has the diagnosis of obesity and her BMI today is 33.84 Toye is in the action stage of change   ADVISE: [place] was educated on the multiple health risks of obesity as well as the benefit of weight loss to improve her health. She was advised of the need for long term treatment and the importance of lifestyle modifications to  improve her current health and to decrease her risk of future health problems.  AGREE: Multiple dietary modification options and treatment options were discussed and  Porschia agreed  to follow the recommendations documented in the above note.  ARRANGE: Beyonka was educated on the importance of frequent visits to treat obesity as outlined per CMS and USPSTF guidelines and agreed to schedule her next follow up appointment today.  Corey Skains, am acting as transcriptionist for Abby Potash, PA-C I, Abby Potash, PA-C have reviewed above note and agree with its content

## 2018-07-01 ENCOUNTER — Ambulatory Visit (INDEPENDENT_AMBULATORY_CARE_PROVIDER_SITE_OTHER): Payer: 59 | Admitting: Physician Assistant

## 2018-07-01 VITALS — BP 120/76 | HR 80 | Temp 98.1°F | Ht 63.0 in | Wt 192.0 lb

## 2018-07-01 DIAGNOSIS — E559 Vitamin D deficiency, unspecified: Secondary | ICD-10-CM

## 2018-07-01 DIAGNOSIS — Z6834 Body mass index (BMI) 34.0-34.9, adult: Secondary | ICD-10-CM | POA: Diagnosis not present

## 2018-07-01 DIAGNOSIS — E669 Obesity, unspecified: Secondary | ICD-10-CM | POA: Diagnosis not present

## 2018-07-01 DIAGNOSIS — Z9189 Other specified personal risk factors, not elsewhere classified: Secondary | ICD-10-CM | POA: Diagnosis not present

## 2018-07-01 DIAGNOSIS — E8881 Metabolic syndrome: Secondary | ICD-10-CM | POA: Diagnosis not present

## 2018-07-01 MED ORDER — VITAMIN D (ERGOCALCIFEROL) 1.25 MG (50000 UNIT) PO CAPS: 50000.0000 [IU] | ORAL_CAPSULE | ORAL | 0 refills | Status: DC

## 2018-07-01 MED FILL — VIT D2 1.25 MG (50,000 UNIT: 1.25 MG | 28 days supply | Qty: 4 | Fill #0

## 2018-07-05 NOTE — Progress Notes (Signed)
Office: [phone]  /  Fax: [phone]   HPI:   Chief Complaint: OBESITY Kristi Huynh is here to discuss her progress with her obesity treatment plan. She is on the Category 2 plan and is following her eating plan approximately 55 % of the time. She states she is walking 60 minutes 3 to 4 times per week. Kristi Huynh struggled to follow the plan due to stress. She does report that she has gotten off track with eating meat and she would like another option. Her weight is 192 lb (87.1 kg) today and has not lost weight since her last visit. She has lost 13 lbs since starting treatment with [name].  Vitamin D deficiency Kristi Huynh has a diagnosis of vitamin D deficiency. She is currently taking vit D and denies nausea, vomiting or muscle weakness.  At risk for osteopenia and osteoporosis Kristi Huynh is at higher risk of osteopenia and osteoporosis due to vitamin D deficiency.   Insulin Resistance Kristi Huynh has a diagnosis of insulin resistance based on her elevated fasting insulin level >5. Although [name]'s blood glucose readings are still under good control, insulin resistance puts her at greater risk of metabolic syndrome and diabetes. She is taking metformin currently and she denies any polyphagia. Kristi Huynh continues to work on diet and exercise to decrease risk of diabetes.  ALLERGIES: Allergies  Allergen Reactions  . Codeine   . Oxycodone Hcl   . Penicillins   . Sulfonamide Derivatives     MEDICATIONS: Current Outpatient Medications on File Prior to Visit  Medication Sig Dispense Refill  . Ascorbic Acid (VITA-C PO) Take 2 tablets by mouth every morning.    . hydrochlorothiazide (HYDRODIURIL) 12.5 MG tablet Take 1 tablet (12.5 mg total) by mouth daily. 30 tablet 0  . Loratadine-Pseudoephedrine (HCA ALLERGY/NASAL DECONGESTANT PO) Take 1 tablet by mouth every morning.    . Melatonin 10 MG TABS Take 1 tablet by mouth at bedtime.    . metFORMIN (GLUCOPHAGE) 500 MG tablet Take 1 tablet (500 mg total) by mouth 2  (two) times daily with a meal. 60 tablet 0  . Misc Natural Products (ENERGY FOCUS) TABS Take 1 tablet by mouth every morning.    . Multiple Vitamin (MULTIVITAMIN) tablet Take 1 tablet by mouth every morning.    . Omega-3 Fatty Acids (FISH OIL) 1000 MG CAPS Take 2 capsules by mouth every morning.     . polyethylene glycol powder (GLYCOLAX/MIRALAX) powder Take 17 g by mouth every morning. 3350 g 0  . Probiotic Product (PROBIOTIC-10 PO) Take 1 tablet by mouth every morning.    Kristi Huynh Solian [name] 150 MG TABS Take 4 tablets by mouth every morning.    . vitamin B-12 (CYANOCOBALAMIN) 1000 MCG tablet Take 1,000 mcg by mouth daily.     No current facility-administered medications on file prior to visit.     PAST MEDICAL HISTORY: Past Medical History:  Diagnosis Date  . Anxiety   . back pain   . Depression   . joint pain   . Swelling    feet and legs    PAST SURGICAL HISTORY: Past Surgical History:  Procedure Laterality Date  . ANKLE SURGERY     Right 2 times, once as teen and once at age 59  . CYST REMOVAL HAND     Right hand age 65 and 61  . LAPAROSCOPY     endometrosis  . TONSILLECTOMY AND ADENOIDECTOMY     52 years old    SOCIAL HISTORY: Social History  Tobacco Use  . Smoking status: Never Smoker  . Smokeless tobacco: Never Used  Substance Use Topics  . Alcohol use: Not on file  . Drug use: Not on file    FAMILY HISTORY: Family History  Problem Relation Age of Onset  . Diabetes Mother   . Hypertension Mother   . Hyperlipidemia Mother   . Thyroid disease Mother   . Depression Mother   . Anxiety disorder Mother   . Eating disorder Mother   . Obesity Mother   . Heart disease Father   . Sudden death Father     ROS: Review of Systems  Constitutional: Negative for weight loss.  Gastrointestinal: Negative for nausea and vomiting.  Musculoskeletal:       Negative for muscle weakness  Endo/Heme/Allergies:       Negative for polyphagia    PHYSICAL EXAM: Blood  pressure 120/76, pulse 80, temperature 98.1 F (36.7 C), temperature source Oral, height 5\' 3"  (1.6 m), weight 192 lb (87.1 kg), SpO2 96 %. Body mass index is 34.01 kg/m. Physical Exam  Constitutional: She is oriented to person, place, and time. She appears well-developed and well-nourished.  Cardiovascular: Normal rate.  Pulmonary/Chest: Effort normal.  Musculoskeletal: Normal range of motion.  Neurological: She is oriented to person, place, and time.  Skin: Skin is warm and dry.  Psychiatric: She has a normal mood and affect. Her behavior is normal.  Vitals reviewed.   RECENT LABS AND TESTS: BMET    Component Value Date/Time   NA 142 04/21/2018 0733   K 4.0 04/21/2018 0733   CL 100 04/21/2018 0733   CO2 27 04/21/2018 0733   GLUCOSE 94 04/21/2018 0733   GLUCOSE 93 12/03/2009 2056   BUN 10 04/21/2018 0733   CREATININE 0.80 04/21/2018 0733   CALCIUM 9.4 04/21/2018 0733   GFRNONAA 84 04/21/2018 0733   GFRAA 97 04/21/2018 0733   Lab Results  Component Value Date   HGBA1C 5.5 04/21/2018   HGBA1C 5.6 03/11/2017   HGBA1C 5.4 10/09/2016   Lab Results  Component Value Date   INSULIN 18.9 04/21/2018   INSULIN 21.3 03/11/2017   INSULIN 14.4 10/09/2016   CBC    Component Value Date/Time   WBC 8.8 10/09/2016 1442   WBC 6.2 10/24/2008 2117   RBC 4.84 10/09/2016 1442   RBC 4.64 10/24/2008 2117   HGB 14.4 10/09/2016 1442   HCT 42.4 10/09/2016 1442   PLT 252 10/24/2008 2117   MCV 88 10/09/2016 1442   MCH 29.8 10/09/2016 1442   MCHC 34.0 10/09/2016 1442   MCHC 32.6 10/24/2008 2117   RDW 13.7 10/09/2016 1442   LYMPHSABS 2.8 10/09/2016 1442   EOSABS 0.1 10/09/2016 1442   BASOSABS 0.1 10/09/2016 1442   Iron/TIBC/Ferritin/ %Sat No results found for: IRON, TIBC, FERRITIN, IRONPCTSAT Lipid Panel     Component Value Date/Time   CHOL 186 04/21/2018 0733   TRIG 202 (H) 04/21/2018 0733   HDL 36 (L) 04/21/2018 0733   CHOLHDL 5.2 (H) 04/21/2018 0733   CHOLHDL 4.7 Ratio  12/03/2009 2056   VLDL 27 12/03/2009 2056   LDLCALC 110 (H) 04/21/2018 0733   Hepatic Function Panel     Component Value Date/Time   PROT 7.2 04/21/2018 0733   ALBUMIN 4.4 04/21/2018 0733   AST 20 04/21/2018 0733   ALT 14 04/21/2018 0733   ALKPHOS 60 04/21/2018 0733   BILITOT 0.5 04/21/2018 0733      Component Value Date/Time   TSH 1.970 10/09/2016 1442  TSH 1.990 12/03/2009 2056   TSH 2.142 10/24/2008 2117   Results for LARAYA, PESTKA (MRN [id number]) as of 07/05/2018 09:19  Ref. Range 04/21/2018 07:33  Vitamin D, 25-Hydroxy Latest Ref Range: 30.0 - 100.0 ng/mL 31.5   ASSESSMENT AND PLAN: Vitamin D deficiency - Plan: Vitamin D, Ergocalciferol, (DRISDOL) 50000 units CAPS capsule  Insulin resistance  At risk for osteoporosis  Class 1 obesity with serious comorbidity and body mass index (BMI) of 34.0 to 34.9 in adult, unspecified obesity type  PLAN:  Vitamin D Deficiency Ralyn was informed that low vitamin D levels contributes to fatigue and are associated with obesity, breast, and colon cancer. She agrees to continue to take prescription Vit D @50 ,000 IU every week #4 with no refills and will follow up for routine testing of vitamin D, at least 2-3 times per year. She was informed of the risk of over-replacement of vitamin D and agrees to not increase her dose unless she discusses this with [name] first. Annabel agrees to follow up as directed.  At risk for osteopenia and osteoporosis Laverta was given extended  (15 minutes) osteoporosis prevention counseling today. Quenna is at risk for osteopenia and osteoporosis due to her vitamin D deficiency. She was encouraged to take her vitamin D and follow her higher calcium diet and increase strengthening exercise to help strengthen her bones and decrease her risk of osteopenia and osteoporosis.  Insulin Resistance Jaeleigh will continue to work on weight loss, exercise, and decreasing simple carbohydrates in her diet to help decrease the  risk of diabetes. We dicussed metformin including benefits and risks. She was informed that eating too many simple carbohydrates or too many calories at one sitting increases the likelihood of GI side effects. Keven will continue metformin for now and prescription was not written today. Desyre agreed to follow up with [name] as directed to monitor her progress.  Obesity Norman is currently in the action stage of change. As such, her goal is to continue with weight loss efforts She has agreed to follow the Pescatarian eating plan [place]ayette has been instructed to work up to a goal of 150 minutes of combined cardio and strengthening exercise per week for weight loss and overall health benefits. We discussed the following Behavioral Modification Strategies today: increasing lean protein intake and ways to avoid boredom eating  Kendal has agreed to follow up with our clinic in 3 weeks. She was informed of the importance of frequent follow up visits to maximize her success with intensive lifestyle modifications for her multiple health conditions.   OBESITY BEHAVIORAL INTERVENTION VISIT  Today's visit was # 30  Starting weight: 205 lbs Starting date: 10/09/16 Today's weight : 192 lbs  Today's date: 07/01/2018 Total lbs lost to date: 13   ASK: We discussed the diagnosis of obesity with [name] today and Hoang agreed to give [name] permission to discuss obesity behavioral modification therapy today.  ASSESS: Caralina has the diagnosis of obesity and her BMI today is 34.02 Marly is in the action stage of change   ADVISE: Inocencia was educated on the multiple health risks of obesity as well as the benefit of weight loss to improve her health. She was advised of the need for long term treatment and the importance of lifestyle modifications to improve her current health and to decrease her risk of future health problems.  AGREE: Multiple dietary modification options and treatment options were discussed and   Collene agreed to follow the recommendations documented in the  above note.  ARRANGE: Lexie was educated on the importance of frequent visits to treat obesity as outlined per CMS and USPSTF guidelines and agreed to schedule her next follow up appointment today.  Corey Skains, am acting as transcriptionist for Abby Potash, PA-C I, Abby Potash, PA-C have reviewed above note and agree with its content

## 2018-07-22 ENCOUNTER — Ambulatory Visit (INDEPENDENT_AMBULATORY_CARE_PROVIDER_SITE_OTHER): Payer: 59 | Admitting: Physician Assistant

## 2018-07-27 ENCOUNTER — Ambulatory Visit (INDEPENDENT_AMBULATORY_CARE_PROVIDER_SITE_OTHER): Payer: 59 | Admitting: Physician Assistant

## 2018-07-27 VITALS — HR 84 | Temp 97.9°F | Ht 63.0 in | Wt 191.0 lb

## 2018-07-27 DIAGNOSIS — E559 Vitamin D deficiency, unspecified: Secondary | ICD-10-CM | POA: Diagnosis not present

## 2018-07-27 DIAGNOSIS — E669 Obesity, unspecified: Secondary | ICD-10-CM | POA: Diagnosis not present

## 2018-07-27 DIAGNOSIS — E8881 Metabolic syndrome: Secondary | ICD-10-CM | POA: Diagnosis not present

## 2018-07-27 DIAGNOSIS — Z9189 Other specified personal risk factors, not elsewhere classified: Secondary | ICD-10-CM | POA: Diagnosis not present

## 2018-07-27 DIAGNOSIS — E66811 Obesity, class 1: Secondary | ICD-10-CM

## 2018-07-27 DIAGNOSIS — Z6833 Body mass index (BMI) 33.0-33.9, adult: Secondary | ICD-10-CM | POA: Diagnosis not present

## 2018-07-27 DIAGNOSIS — E88819 Insulin resistance, unspecified: Secondary | ICD-10-CM

## 2018-07-27 MED ORDER — VITAMIN D (ERGOCALCIFEROL) 1.25 MG (50000 UNIT) PO CAPS: 50000.0000 [IU] | ORAL_CAPSULE | ORAL | 0 refills | Status: DC

## 2018-07-27 MED FILL — VIT D2 1.25 MG (50,000 UNIT: 1.25 MG | 28 days supply | Qty: 4 | Fill #0

## 2018-07-28 NOTE — Progress Notes (Signed)
Office: [phone]  /  Fax: [phone]   HPI:   Chief Complaint: OBESITY Yan is here to discuss her progress with her obesity treatment plan. She is on the Pescatarian eating plan and is following her eating plan approximately 60 % of the time. She states she is walking for 60 minutes 3 times per week. Trystyn did well with weight loss. She states that she is very stressed with a family situation at home, and has a lot of stress at work. She is not getting all of her protein in. Her weight is 191 lb (86.6 kg) today and has had a weight loss of 1 pound over a period of 3 to 4 weeks since her last visit. She has lost 14 lbs since starting treatment with [name].  Vitamin D Deficiency Mysty has a diagnosis of vitamin D deficiency. She is currently taking prescription Vit D and denies nausea, vomiting or muscle weakness.  At risk for osteopenia and osteoporosis Kenishia is at higher risk of osteopenia and osteoporosis due to vitamin D deficiency.   Insulin Resistance Sera has a diagnosis of insulin resistance based on her elevated fasting insulin level >5. Although Ann's blood glucose readings are still under good control, insulin resistance puts her at greater risk of metabolic syndrome and diabetes. She denies polyphagia on metformin and continues to work on diet and exercise to decrease risk of diabetes. She denies nausea, vomiting, or diarrhea.  ALLERGIES: Allergies  Allergen Reactions  . Codeine   . Oxycodone Hcl   . Penicillins   . Sulfonamide Derivatives     MEDICATIONS: Current Outpatient Medications on File Prior to Visit  Medication Sig Dispense Refill  . Ascorbic Acid (VITA-C PO) Take 2 tablets by mouth every morning.    . hydrochlorothiazide (HYDRODIURIL) 12.5 MG tablet Take 1 tablet (12.5 mg total) by mouth daily. 30 tablet 0  . Loratadine-Pseudoephedrine (HCA ALLERGY/NASAL DECONGESTANT PO) Take 1 tablet by mouth every morning.    . Melatonin 10 MG TABS Take 1 tablet by  mouth at bedtime.    . metFORMIN (GLUCOPHAGE) 500 MG tablet Take 1 tablet (500 mg total) by mouth 2 (two) times daily with a meal. 60 tablet 0  . Misc Natural Products (ENERGY FOCUS) TABS Take 1 tablet by mouth every morning.    . Multiple Vitamin (MULTIVITAMIN) tablet Take 1 tablet by mouth every morning.    . Omega-3 Fatty Acids (FISH OIL) 1000 MG CAPS Take 2 capsules by mouth every morning.     . polyethylene glycol powder (GLYCOLAX/MIRALAX) powder Take 17 g by mouth every morning. 3350 g 0  . Probiotic Product (PROBIOTIC-10 PO) Take 1 tablet by mouth every morning.    [name] 150 MG TABS Take 4 tablets by mouth every morning.    . vitamin B-12 (CYANOCOBALAMIN) 1000 MCG tablet Take 1,000 mcg by mouth daily.     No current facility-administered medications on file prior to visit.     PAST MEDICAL HISTORY: Past Medical History:  Diagnosis Date  . Anxiety   . back pain   . Depression   . joint pain   . Swelling    feet and legs    PAST SURGICAL HISTORY: Past Surgical History:  Procedure Laterality Date  . ANKLE SURGERY     Right 2 times, once as teen and once at age 58  . CYST REMOVAL HAND     Right hand age 82 and 66  . LAPAROSCOPY  endometrosis  . TONSILLECTOMY AND ADENOIDECTOMY     52 years old    SOCIAL HISTORY: Social History   Tobacco Use  . Smoking status: Never Smoker  . Smokeless tobacco: Never Used  Substance Use Topics  . Alcohol use: Not on file  . Drug use: Not on file    FAMILY HISTORY: Family History  Problem Relation Age of Onset  . Diabetes Mother   . Hypertension Mother   . Hyperlipidemia Mother   . Thyroid disease Mother   . Depression Mother   . Anxiety disorder Mother   . Eating disorder Mother   . Obesity Mother   . Heart disease Father   . Sudden death Father     ROS: Review of Systems  Constitutional: Positive for weight loss.  Gastrointestinal: Negative for diarrhea, nausea and vomiting.  Musculoskeletal:        Negative muscle weakness  Endo/Heme/Allergies:       Negative polyphagia    PHYSICAL EXAM: Pulse 84, temperature 97.9 F (36.6 C), temperature source Oral, height 5\' 3"  (1.6 m), weight 191 lb (86.6 kg), SpO2 97 %. Body mass index is 33.83 kg/m. Physical Exam  Constitutional: She is oriented to person, place, and time. She appears well-developed and well-nourished.  Cardiovascular: Normal rate.  Pulmonary/Chest: Effort normal.  Musculoskeletal: Normal range of motion.  Neurological: She is oriented to person, place, and time.  Skin: Skin is warm and dry.  Psychiatric: She has a normal mood and affect. Her behavior is normal.  Vitals reviewed.   RECENT LABS AND TESTS: BMET    Component Value Date/Time   NA 142 04/21/2018 0733   K 4.0 04/21/2018 0733   CL 100 04/21/2018 0733   CO2 27 04/21/2018 0733   GLUCOSE 94 04/21/2018 0733   GLUCOSE 93 12/03/2009 2056   BUN 10 04/21/2018 0733   CREATININE 0.80 04/21/2018 0733   CALCIUM 9.4 04/21/2018 0733   GFRNONAA 84 04/21/2018 0733   GFRAA 97 04/21/2018 0733   Lab Results  Component Value Date   HGBA1C 5.5 04/21/2018   HGBA1C 5.6 03/11/2017   HGBA1C 5.4 10/09/2016   Lab Results  Component Value Date   INSULIN 18.9 04/21/2018   INSULIN 21.3 03/11/2017   INSULIN 14.4 10/09/2016   CBC    Component Value Date/Time   WBC 8.8 10/09/2016 1442   WBC 6.2 10/24/2008 2117   RBC 4.84 10/09/2016 1442   RBC 4.64 10/24/2008 2117   HGB 14.4 10/09/2016 1442   HCT 42.4 10/09/2016 1442   PLT 252 10/24/2008 2117   MCV 88 10/09/2016 1442   MCH 29.8 10/09/2016 1442   MCHC 34.0 10/09/2016 1442   MCHC 32.6 10/24/2008 2117   RDW 13.7 10/09/2016 1442   LYMPHSABS 2.8 10/09/2016 1442   EOSABS 0.1 10/09/2016 1442   BASOSABS 0.1 10/09/2016 1442   Iron/TIBC/Ferritin/ %Sat No results found for: IRON, TIBC, FERRITIN, IRONPCTSAT Lipid Panel     Component Value Date/Time   CHOL 186 04/21/2018 0733   TRIG 202 (H) 04/21/2018 0733   HDL 36  (L) 04/21/2018 0733   CHOLHDL 5.2 (H) 04/21/2018 0733   CHOLHDL 4.7 Ratio 12/03/2009 2056   VLDL 27 12/03/2009 2056   LDLCALC 110 (H) 04/21/2018 0733   Hepatic Function Panel     Component Value Date/Time   PROT 7.2 04/21/2018 0733   ALBUMIN 4.4 04/21/2018 0733   AST 20 04/21/2018 0733   ALT 14 04/21/2018 0733   ALKPHOS 60 04/21/2018 0733  BILITOT 0.5 04/21/2018 0733      Component Value Date/Time   TSH 1.970 10/09/2016 1442   TSH 1.990 12/03/2009 2056   TSH 2.142 10/24/2008 2117  Results for DESTA, BUJAK (MRN [id number]) as of 07/28/2018 13:18  Ref. Range 04/21/2018 07:33  Vitamin D, 25-Hydroxy Latest Ref Range: 30.0 - 100.0 ng/mL 31.5    ASSESSMENT AND PLAN: Vitamin D deficiency - Plan: Vitamin D, Ergocalciferol, (DRISDOL) 50000 units CAPS capsule  Insulin resistance  At risk for osteoporosis  Class 1 obesity with serious comorbidity and body mass index (BMI) of 33.0 to 33.9 in adult, unspecified obesity type  PLAN:  Vitamin D Deficiency Lealer was informed that low vitamin D levels contributes to fatigue and are associated with obesity, breast, and colon cancer. Havah agrees to continue taking prescription Vit D @50 ,000 IU every week #4 and we will refill for 1 month. She will follow up for routine testing of vitamin D, at least 2-3 times per year. She was informed of the risk of over-replacement of vitamin D and agrees to not increase her dose unless she discusses this with [name] first. Arianni agrees to follow up with our clinic in 3 weeks.  At risk for osteopenia and osteoporosis Bettylee was given extended (15 minutes) osteoporosis prevention counseling today. Torsha is at risk for osteopenia and osteoporsis due to her vitamin D deficiency. She was encouraged to take her vitamin D and follow her higher calcium diet and increase strengthening exercise to help strengthen her bones and decrease her risk of osteopenia and osteoporosis.  Insulin Resistance Pailynn will continue  to work on weight loss, diet, exercise, and decreasing simple carbohydrates in her diet to help decrease the risk of diabetes. We dicussed metformin including benefits and risks. She was informed that eating too many simple carbohydrates or too many calories at one sitting increases the likelihood of GI side effects. Otisha agrees to continue taking metformin, and she agrees to follow up with our clinic in 3 weeks as directed to monitor her progress.  Obesity Nelani is currently in the action stage of change. As such, her goal is to continue with weight loss efforts She has agreed to follow the Pescatarian eating plan Ronny has been instructed to work up to a goal of 150 minutes of combined cardio and strengthening exercise per week for weight loss and overall health benefits. We discussed the following Behavioral Modification Strategies today: increasing lean protein intake and work on meal planning and easy cooking plans   Shaunessy has agreed to follow up with our clinic in 3 weeks. She was informed of the importance of frequent follow up visits to maximize her success with intensive lifestyle modifications for her multiple health conditions.   OBESITY BEHAVIORAL INTERVENTION VISIT  Today's visit was # 31  Starting weight: 205 lbs Starting date: 10/09/16 Today's weight : 191 lbs  Today's date: 07/27/2018 Total lbs lost to date: 14    ASK: We discussed the diagnosis of obesity with [name] today and Virjean agreed to give [name] permission to discuss obesity behavioral modification therapy today.  ASSESS: Janith has the diagnosis of obesity and her BMI today is 33.84 Kylina is in the action stage of change   ADVISE: Leiloni was educated on the multiple health risks of obesity as well as the benefit of weight loss to improve her health. She was advised of the need for long term treatment and the importance of lifestyle modifications.  AGREE: Multiple dietary modification options  and  treatment options were discussed and  Ronna agreed to the above obesity treatment plan.  Wilhemena Durie, am acting as transcriptionist for Abby Potash, PA-C I, Abby Potash, PA-C have reviewed above note and agree with its content

## 2018-08-17 ENCOUNTER — Ambulatory Visit (INDEPENDENT_AMBULATORY_CARE_PROVIDER_SITE_OTHER): Payer: 59 | Admitting: Physician Assistant

## 2018-08-17 VITALS — BP 122/82 | HR 80 | Temp 98.5°F | Ht 63.0 in | Wt 193.0 lb

## 2018-08-17 DIAGNOSIS — I10 Essential (primary) hypertension: Secondary | ICD-10-CM | POA: Diagnosis not present

## 2018-08-17 DIAGNOSIS — E7849 Other hyperlipidemia: Secondary | ICD-10-CM

## 2018-08-17 DIAGNOSIS — Z9189 Other specified personal risk factors, not elsewhere classified: Secondary | ICD-10-CM | POA: Diagnosis not present

## 2018-08-17 DIAGNOSIS — E8881 Metabolic syndrome: Secondary | ICD-10-CM

## 2018-08-17 DIAGNOSIS — E669 Obesity, unspecified: Secondary | ICD-10-CM | POA: Diagnosis not present

## 2018-08-17 DIAGNOSIS — E559 Vitamin D deficiency, unspecified: Secondary | ICD-10-CM

## 2018-08-17 DIAGNOSIS — Z6834 Body mass index (BMI) 34.0-34.9, adult: Secondary | ICD-10-CM

## 2018-08-17 MED ORDER — HYDROCHLOROTHIAZIDE 12.5 MG PO TABS: 12.5000 mg | ORAL_TABLET | Freq: Every day | ORAL | 0 refills | Status: DC

## 2018-08-17 MED ORDER — METFORMIN HCL 500 MG PO TABS: 500.0000 mg | ORAL_TABLET | Freq: Two times a day (BID) | ORAL | 0 refills | Status: DC

## 2018-08-17 MED FILL — HYDROCHLOROTHIAZIDE 12.5 MG: 12.5 | 30 days supply | Qty: 30 | Fill #0

## 2018-08-17 MED FILL — metFORMIN HCL 500 MG TABS: 500 | 30 days supply | Qty: 60 | Fill #0

## 2018-08-17 NOTE — Progress Notes (Signed)
Office: [phone]  /  Fax: [phone]   HPI:   Chief Complaint: OBESITY Kristi Huynh is here to discuss her progress with her obesity treatment plan. She is on the Pescatarian eating plan and is following her eating plan approximately 60 % of the time. She states she is walking 60 minutes 6 times per week. Kristi Huynh reports struggling with the plan due to stress at home as well as work stress. She states she is not getting all of her protein in. Her weight is 193 lb (87.5 kg) today and has had a weight gain of 2 pounds over a period of 3 weeks since her last visit. She has lost 12 lbs since starting treatment with [name].  Insulin Resistance Kristi Huynh has a diagnosis of insulin resistance based on her elevated fasting insulin level >5. Although Kristi Huynh's blood glucose readings are still under good control, insulin resistance puts her at greater risk of metabolic syndrome and diabetes. She is taking metformin currently and she denies polyphagia. Kristi Huynh continues to work on diet and exercise to decrease risk of diabetes. Kristi Huynh denies nausea, vomiting or diarrhea.  Vitamin D deficiency Kristi Huynh has a diagnosis of vitamin D deficiency. She is currently taking prescription vit D and denies nausea, vomiting or muscle weakness.  At risk for osteopenia and osteoporosis Kristi Huynh is at higher risk of osteopenia and osteoporosis due to vitamin D deficiency.   Hypertension Kristi Huynh is a 53 y.o. female with hypertension. Kristi Huynh denies chest pain. She is working weight loss to help control her blood pressure with the goal of decreasing her risk of heart attack and stroke. Kristi Huynh blood pressure is currently controlled.  Hyperlipidemia Kristi Huynh has hyperlipidemia and has been trying to improve her cholesterol levels with intensive lifestyle modification including a low saturated fat diet, exercise and weight loss. Kristi Huynh is currently on fish oil and she denies any chest pain.  ALLERGIES: Allergies  Allergen  Reactions  . Codeine   . Oxycodone Hcl   . Penicillins   . Sulfonamide Derivatives     MEDICATIONS: Current Outpatient Medications on File Prior to Visit  Medication Sig Dispense Refill  . Ascorbic Acid (VITA-C PO) Take 2 tablets by mouth every morning.    . hydrochlorothiazide (HYDRODIURIL) 12.5 MG tablet Take 1 tablet (12.5 mg total) by mouth daily. 30 tablet 0  . Loratadine-Pseudoephedrine (HCA ALLERGY/NASAL DECONGESTANT PO) Take 1 tablet by mouth every morning.    . Melatonin 10 MG TABS Take 1 tablet by mouth at bedtime.    . metFORMIN (GLUCOPHAGE) 500 MG tablet Take 1 tablet (500 mg total) by mouth 2 (two) times daily with a meal. 60 tablet 0  . Misc Natural Products (ENERGY FOCUS) TABS Take 1 tablet by mouth every morning.    . Multiple Vitamin (MULTIVITAMIN) tablet Take 1 tablet by mouth every morning.    . Omega-3 Fatty Acids (FISH OIL) 1000 MG CAPS Take 2 capsules by mouth every morning.     . polyethylene glycol powder (GLYCOLAX/MIRALAX) powder Take 17 g by mouth every morning. 3350 g 0  . Probiotic Product (PROBIOTIC-10 PO) Take 1 tablet by mouth every morning.    Kristi Huynh 150 MG TABS Take 4 tablets by mouth every morning.    . vitamin B-12 (CYANOCOBALAMIN) 1000 MCG tablet Take 1,000 mcg by mouth daily.    . Vitamin D, Ergocalciferol, (DRISDOL) 50000 units CAPS capsule Take 1 capsule (50,000 Units total) by mouth every 7 (seven) days. 4 capsule 0  No current facility-administered medications on file prior to visit.     PAST MEDICAL HISTORY: Past Medical History:  Diagnosis Date  . Anxiety   . back pain   . Depression   . joint pain   . Swelling    feet and legs    PAST SURGICAL HISTORY: Past Surgical History:  Procedure Laterality Date  . ANKLE SURGERY     Right 2 times, once as teen and once at age 26  . CYST REMOVAL HAND     Right hand age 49 and 53  . LAPAROSCOPY     endometrosis  . TONSILLECTOMY AND ADENOIDECTOMY     52 years old    SOCIAL  HISTORY: Social History   Tobacco Use  . Smoking status: Never Smoker  . Smokeless tobacco: Never Used  Substance Use Topics  . Alcohol use: Not on file  . Drug use: Not on file    FAMILY HISTORY: Family History  Problem Relation Age of Onset  . Diabetes Mother   . Hypertension Mother   . Hyperlipidemia Mother   . Thyroid disease Mother   . Depression Mother   . Anxiety disorder Mother   . Eating disorder Mother   . Obesity Mother   . Heart disease Father   . Sudden death Father     ROS: Review of Systems  Constitutional: Negative for weight loss.  Cardiovascular: Negative for chest pain.  Gastrointestinal: Negative for diarrhea, nausea and vomiting.  Musculoskeletal:       Negative for muscle weakness  Endo/Heme/Allergies:       Negative for polyphagia    PHYSICAL EXAM: Blood pressure 122/82, pulse 80, temperature 98.5 F (36.9 C), temperature source Oral, height 5\' 3"  (1.6 m), weight 193 lb (87.5 kg), SpO2 99 %. Body mass index is 34.19 kg/m. Physical Exam  Constitutional: She is oriented to person, place, and time. She appears well-developed and well-nourished.  Cardiovascular: Normal rate.  Pulmonary/Chest: Effort normal.  Musculoskeletal: Normal range of motion.  Neurological: She is oriented to person, place, and time.  Skin: Skin is warm and dry.  Psychiatric: She has a normal mood and affect. Her behavior is normal.  Vitals reviewed.   RECENT LABS AND TESTS: BMET    Component Value Date/Time   NA 142 04/21/2018 0733   K 4.0 04/21/2018 0733   CL 100 04/21/2018 0733   CO2 27 04/21/2018 0733   GLUCOSE 94 04/21/2018 0733   GLUCOSE 93 12/03/2009 2056   BUN 10 04/21/2018 0733   CREATININE 0.80 04/21/2018 0733   CALCIUM 9.4 04/21/2018 0733   GFRNONAA 84 04/21/2018 0733   GFRAA 97 04/21/2018 0733   Lab Results  Component Value Date   HGBA1C 5.5 04/21/2018   HGBA1C 5.6 03/11/2017   HGBA1C 5.4 10/09/2016   Lab Results  Component Value Date    INSULIN 18.9 04/21/2018   INSULIN 21.3 03/11/2017   INSULIN 14.4 10/09/2016   CBC    Component Value Date/Time   WBC 8.8 10/09/2016 1442   WBC 6.2 10/24/2008 2117   RBC 4.84 10/09/2016 1442   RBC 4.64 10/24/2008 2117   HGB 14.4 10/09/2016 1442   HCT 42.4 10/09/2016 1442   PLT 252 10/24/2008 2117   MCV 88 10/09/2016 1442   MCH 29.8 10/09/2016 1442   MCHC 34.0 10/09/2016 1442   MCHC 32.6 10/24/2008 2117   RDW 13.7 10/09/2016 1442   LYMPHSABS 2.8 10/09/2016 1442   EOSABS 0.1 10/09/2016 1442   BASOSABS 0.1  10/09/2016 1442   Iron/TIBC/Ferritin/ %Sat No results found for: IRON, TIBC, FERRITIN, IRONPCTSAT Lipid Panel     Component Value Date/Time   CHOL 186 04/21/2018 0733   TRIG 202 (H) 04/21/2018 0733   HDL 36 (L) 04/21/2018 0733   CHOLHDL 5.2 (H) 04/21/2018 0733   CHOLHDL 4.7 Ratio 12/03/2009 2056   VLDL 27 12/03/2009 2056   LDLCALC 110 (H) 04/21/2018 0733   Hepatic Function Panel     Component Value Date/Time   PROT 7.2 04/21/2018 0733   ALBUMIN 4.4 04/21/2018 0733   AST 20 04/21/2018 0733   ALT 14 04/21/2018 0733   ALKPHOS 60 04/21/2018 0733   BILITOT 0.5 04/21/2018 0733      Component Value Date/Time   TSH 1.970 10/09/2016 1442   TSH 1.990 12/03/2009 2056   TSH 2.142 10/24/2008 2117   Results for THIRZA, PELLICANO (MRN [id number]) as of 08/17/2018 12:26  Ref. Range 04/21/2018 07:33  Vitamin D, 25-Hydroxy Latest Ref Range: 30.0 - 100.0 ng/mL 31.5   ASSESSMENT AND PLAN: Insulin resistance - Plan: Comprehensive metabolic panel, Hemoglobin A1c, Insulin, random, metFORMIN (GLUCOPHAGE) 500 MG tablet  Vitamin D deficiency - Plan: VITAMIN D 25 Hydroxy (Vit-D Deficiency, Fractures)  Essential hypertension - Plan: hydrochlorothiazide (HYDRODIURIL) 12.5 MG tablet  Other hyperlipidemia - Plan: Lipid Panel With LDL/HDL Ratio  At risk for osteoporosis  Class 1 obesity with serious comorbidity and body mass index (BMI) of 34.0 to 34.9 in adult, unspecified obesity  type  PLAN:  Insulin Resistance Aaralyn will continue to work on weight loss, exercise, and decreasing simple carbohydrates in her diet to help decrease the risk of diabetes. We dicussed metformin including benefits and risks. She was informed that eating too many simple carbohydrates or too many calories at one sitting increases the likelihood of GI side effects. Sanaz agrees to continue metformin for now and prescription was written today for 1 month refill. We will check Hgb A1c and insulin today. Melane agreed to follow up with [name] as directed to monitor her progress.  Vitamin D Deficiency Jensyn was informed that low vitamin D levels contributes to fatigue and are associated with obesity, breast, and colon cancer. She agrees to continue to take prescription Vit D @50 ,000 IU every week and will follow up for routine testing of vitamin D, at least 2-3 times per year. She was informed of the risk of over-replacement of vitamin D and agrees to not increase her dose unless she discusses this with [name] first. We will check vitamin D level today and Laurisa will follow up as directed.  At risk for osteopenia and osteoporosis Carletta was given extended  (15 minutes) osteoporosis prevention counseling today. Kamarri is at risk for osteopenia and osteoporosis due to her vitamin D deficiency. She was encouraged to take her vitamin D and follow her higher calcium diet and increase strengthening exercise to help strengthen her bones and decrease her risk of osteopenia and osteoporosis.  Hypertension We discussed sodium restriction, working on healthy weight loss, and a regular exercise program as the means to achieve improved blood pressure control. Tyliyah agreed with this plan and agreed to follow up as directed. We will continue to monitor her blood pressure as well as her progress with the above lifestyle modifications. She agrees to continue HCTZ 12.5 mg qd #30 with no refills and will watch for signs of hypotension as  she continues her lifestyle modifications.  Hyperlipidemia Sohana was informed of the American Heart Association Guidelines emphasizing intensive lifestyle  modifications as the first line treatment for hyperlipidemia. We discussed many lifestyle modifications today in depth, and Quanita will continue to work on decreasing saturated fats such as fatty red meat, butter and many fried foods. She will also increase vegetables and lean protein in her diet and continue to work on exercise and weight loss efforts. Herberta will continue with fish oil and we will check labs today.  Obesity Geovana is currently in the action stage of change. As such, her goal is to continue with weight loss efforts She has agreed to follow the Category 2 plan Mirian has been instructed to work up to a goal of 150 minutes of combined cardio and strengthening exercise per week for weight loss and overall health benefits. We discussed the following Behavioral Modification Strategies today: increasing lean protein intake and work on meal planning and easy cooking plans  Patrecia has agreed to follow up with our clinic in 3 weeks. She was informed of the importance of frequent follow up visits to maximize her success with intensive lifestyle modifications for her multiple health conditions.   OBESITY BEHAVIORAL INTERVENTION VISIT  Today's visit was # 32  Starting weight: 205 lbs Starting date: 10/09/16 Today's weight : 193 lbs   Today's date: 08/17/2018 Total lbs lost to date: 12   ASK: We discussed the diagnosis of obesity with Kristi Huynh today and Tae agreed to give [name] permission to discuss obesity behavioral modification therapy today.  ASSESS: Laurence has the diagnosis of obesity and her BMI today is 34.2 Kumiko is in the action stage of change   ADVISE: Tyrell was educated on the multiple health risks of obesity as well as the benefit of weight loss to improve her health. She was advised of the need for long term  treatment and the importance of lifestyle modifications to improve her current health and to decrease her risk of future health problems.  AGREE: Multiple dietary modification options and treatment options were discussed and  Alaena agreed to follow the recommendations documented in the above note.  ARRANGE: Gentry was educated on the importance of frequent visits to treat obesity as outlined per CMS and USPSTF guidelines and agreed to schedule her next follow up appointment today.  Corey Skains, am acting as transcriptionist for Abby Potash, PA_C I, Abby Potash, PA-C have reviewed above note and agree with its content

## 2018-08-18 LAB — LIPID PANEL WITH LDL/HDL RATIO
Cholesterol, Total: 222 mg/dL — ABNORMAL HIGH (ref 100–199)
HDL: 40 mg/dL (ref >39)
LDL Calculated: 156 mg/dL — ABNORMAL HIGH (ref 0–99)
LDl/HDL Ratio: 3.9 ratio — ABNORMAL HIGH (ref 0.0–3.2)
Triglycerides: 129 mg/dL (ref 0–149)
VLDL CHOLESTEROL CAL: 26 mg/dL (ref 5–40)

## 2018-08-18 LAB — COMPREHENSIVE METABOLIC PANEL
A/G RATIO: 1.6 (ref 1.2–2.2)
ALBUMIN: 4.6 g/dL (ref 3.5–5.5)
ALT: 14 IU/L (ref 0–32)
AST: 21 IU/L (ref 0–40)
Alkaline Phosphatase: 57 IU/L (ref 39–117)
BUN/Creatinine Ratio: 14 (ref 9–23)
BUN: 12 mg/dL (ref 6–24)
Bilirubin Total: 0.4 mg/dL (ref 0.0–1.2)
CALCIUM: 9.5 mg/dL (ref 8.7–10.2)
CO2: 24 mmol/L (ref 20–29)
Chloride: 100 mmol/L (ref 96–106)
Creatinine, Ser: 0.87 mg/dL (ref 0.57–1.00)
GFR calc Af Amer: 87 mL/min/{1.73_m2} (ref >59)
GFR, EST NON AFRICAN AMERICAN: 76 mL/min/{1.73_m2} (ref >59)
Globulin, Total: 2.9 g/dL (ref 1.5–4.5)
Glucose: 98 mg/dL (ref 65–99)
POTASSIUM: 4.2 mmol/L (ref 3.5–5.2)
SODIUM: 139 mmol/L (ref 134–144)
TOTAL PROTEIN: 7.5 g/dL (ref 6.0–8.5)

## 2018-08-18 LAB — INSULIN, RANDOM: INSULIN: 23.8 u[IU]/mL (ref 2.6–24.9)

## 2018-08-18 LAB — VITAMIN D 25 HYDROXY (VIT D DEFICIENCY, FRACTURES): VIT D 25 HYDROXY: 53.4 ng/mL (ref 30.0–100.0)

## 2018-08-18 LAB — HEMOGLOBIN A1C
Est. average glucose Bld gHb Est-mCnc: 111 mg/dL
Hgb A1c MFr Bld: 5.5 % (ref 4.8–5.6)

## 2018-09-07 ENCOUNTER — Ambulatory Visit (INDEPENDENT_AMBULATORY_CARE_PROVIDER_SITE_OTHER): Payer: 59 | Admitting: Physician Assistant

## 2018-09-07 ENCOUNTER — Encounter (INDEPENDENT_AMBULATORY_CARE_PROVIDER_SITE_OTHER): Payer: Self-pay | Admitting: Physician Assistant

## 2018-09-07 VITALS — BP 135/81 | HR 79 | Temp 98.1°F | Ht 63.0 in | Wt 193.0 lb

## 2018-09-07 DIAGNOSIS — Z6834 Body mass index (BMI) 34.0-34.9, adult: Secondary | ICD-10-CM | POA: Diagnosis not present

## 2018-09-07 DIAGNOSIS — E669 Obesity, unspecified: Secondary | ICD-10-CM

## 2018-09-07 DIAGNOSIS — E559 Vitamin D deficiency, unspecified: Secondary | ICD-10-CM

## 2018-09-07 NOTE — Progress Notes (Signed)
Office: [phone]  /  Fax: [phone]   HPI:   Chief Complaint: OBESITY Kristi Huynh is here to discuss her progress with her obesity treatment plan. She is on the Category 2 plan and is following her eating plan approximately 50 % of the time. She states she is walking 2 miles 4 times per week. Kristi Huynh reports not getting in all of her protein daily. She is stressed with family situations at home.  Her weight is 193 lb (87.5 kg) today and has not lost weight since her last visit. She has lost 12 lbs since starting treatment with [name].  Vitamin D Deficiency Kristi Huynh has a diagnosis of vitamin D deficiency. She is on prescription Vit D, and last level was at goal. She denies nausea, vomiting or muscle weakness.  ALLERGIES: Allergies  Allergen Reactions  . Codeine   . Oxycodone Hcl   . Penicillins   . Sulfonamide Derivatives     MEDICATIONS: Current Outpatient Medications on File Prior to Visit  Medication Sig Dispense Refill  . Ascorbic Acid (VITA-C PO) Take 2 tablets by mouth every morning.    . hydrochlorothiazide (HYDRODIURIL) 12.5 MG tablet Take 1 tablet (12.5 mg total) by mouth daily. 30 tablet 0  . Loratadine-Pseudoephedrine (HCA ALLERGY/NASAL DECONGESTANT PO) Take 1 tablet by mouth every morning.    . Melatonin 10 MG TABS Take 1 tablet by mouth at bedtime.    . metFORMIN (GLUCOPHAGE) 500 MG tablet Take 1 tablet (500 mg total) by mouth 2 (two) times daily with a meal. 60 tablet 0  . Misc Natural Products (ENERGY FOCUS) TABS Take 1 tablet by mouth every morning.    . Multiple Vitamin (MULTIVITAMIN) tablet Take 1 tablet by mouth every morning.    . Omega-3 Fatty Acids (FISH OIL) 1000 MG CAPS Take 2 capsules by mouth every morning.     . polyethylene glycol powder (GLYCOLAX/MIRALAX) powder Take 17 g by mouth every morning. 3350 g 0  . Probiotic Product (PROBIOTIC-10 PO) Take 1 tablet by mouth every morning.    Kristi Huynh Kristi Huynh 150 MG TABS Take 4 tablets by mouth every morning.    .  vitamin B-12 (CYANOCOBALAMIN) 1000 MCG tablet Take 1,000 mcg by mouth daily.    . Vitamin D, Ergocalciferol, (DRISDOL) 50000 units CAPS capsule Take 1 capsule (50,000 Units total) by mouth every 7 (seven) days. 4 capsule 0   No current facility-administered medications on file prior to visit.     PAST MEDICAL HISTORY: Past Medical History:  Diagnosis Date  . Anxiety   . back pain   . Depression   . joint pain   . Swelling    feet and legs    PAST SURGICAL HISTORY: Past Surgical History:  Procedure Laterality Date  . ANKLE SURGERY     Right 2 times, once as teen and once at age 72  . CYST REMOVAL HAND     Right hand age 17 and 56  . LAPAROSCOPY     endometrosis  . TONSILLECTOMY AND ADENOIDECTOMY     52 years old    SOCIAL HISTORY: Social History   Tobacco Use  . Smoking status: Never Smoker  . Smokeless tobacco: Never Used  Substance Use Topics  . Alcohol use: Not on file  . Drug use: Not on file    FAMILY HISTORY: Family History  Problem Relation Age of Onset  . Diabetes Mother   . Hypertension Mother   . Hyperlipidemia Mother   . Thyroid disease  Mother   . Depression Mother   . Anxiety disorder Mother   . Eating disorder Mother   . Obesity Mother   . Heart disease Father   . Sudden death Father     ROS: Review of Systems  Constitutional: Negative for weight loss.  Gastrointestinal: Negative for nausea and vomiting.  Musculoskeletal:       Negative muscle weakness    PHYSICAL EXAM: Blood pressure 135/81, pulse 79, temperature 98.1 F (36.7 C), temperature source Oral, height 5\' 3"  (1.6 m), weight 193 lb (87.5 kg), SpO2 96 %. Body mass index is 34.19 kg/m. Physical Exam  Constitutional: She is oriented to person, place, and time. She appears well-developed and well-nourished.  Cardiovascular: Normal rate.  Pulmonary/Chest: Effort normal.  Musculoskeletal: Normal range of motion.  Neurological: She is oriented to person, place, and time.  Skin:  Skin is warm and dry.  Psychiatric: She has a normal mood and affect. Her behavior is normal.  Vitals reviewed.   RECENT LABS AND TESTS: BMET    Component Value Date/Time   NA 139 08/17/2018 0833   K 4.2 08/17/2018 0833   CL 100 08/17/2018 0833   CO2 24 08/17/2018 0833   GLUCOSE 98 08/17/2018 0833   GLUCOSE 93 12/03/2009 2056   BUN 12 08/17/2018 0833   CREATININE 0.87 08/17/2018 0833   CALCIUM 9.5 08/17/2018 0833   GFRNONAA 76 08/17/2018 0833   GFRAA 87 08/17/2018 0833   Lab Results  Component Value Date   HGBA1C 5.5 08/17/2018   HGBA1C 5.5 04/21/2018   HGBA1C 5.6 03/11/2017   HGBA1C 5.4 10/09/2016   Lab Results  Component Value Date   INSULIN 23.8 08/17/2018   INSULIN 18.9 04/21/2018   INSULIN 21.3 03/11/2017   INSULIN 14.4 10/09/2016   CBC    Component Value Date/Time   WBC 8.8 10/09/2016 1442   WBC 6.2 10/24/2008 2117   RBC 4.84 10/09/2016 1442   RBC 4.64 10/24/2008 2117   HGB 14.4 10/09/2016 1442   HCT 42.4 10/09/2016 1442   PLT 252 10/24/2008 2117   MCV 88 10/09/2016 1442   MCH 29.8 10/09/2016 1442   MCHC 34.0 10/09/2016 1442   MCHC 32.6 10/24/2008 2117   RDW 13.7 10/09/2016 1442   LYMPHSABS 2.8 10/09/2016 1442   EOSABS 0.1 10/09/2016 1442   BASOSABS 0.1 10/09/2016 1442   Iron/TIBC/Ferritin/ %Sat No results found for: IRON, TIBC, FERRITIN, IRONPCTSAT Lipid Panel     Component Value Date/Time   CHOL 222 (H) 08/17/2018 0833   TRIG 129 08/17/2018 0833   HDL 40 08/17/2018 0833   CHOLHDL 5.2 (H) 04/21/2018 0733   CHOLHDL 4.7 Ratio 12/03/2009 2056   VLDL 27 12/03/2009 2056   LDLCALC 156 (H) 08/17/2018 0833   Hepatic Function Panel     Component Value Date/Time   PROT 7.5 08/17/2018 0833   ALBUMIN 4.6 08/17/2018 0833   AST 21 08/17/2018 0833   ALT 14 08/17/2018 0833   ALKPHOS 57 08/17/2018 0833   BILITOT 0.4 08/17/2018 0833      Component Value Date/Time   TSH 1.970 10/09/2016 1442   TSH 1.990 12/03/2009 2056   TSH 2.142 10/24/2008 2117    Results for Kristi Huynh (MRN [id number]) as of 09/07/2018 16:48  Ref. Range 08/17/2018 08:33  Vitamin D, 25-Hydroxy Latest Ref Range: 30.0 - 100.0 ng/mL 53.4    ASSESSMENT AND PLAN: Vitamin D deficiency  Class 1 obesity with serious comorbidity and body mass index (BMI) of 34.0 to 34.9  in adult, unspecified obesity type  PLAN:  Vitamin D Deficiency Kristi Huynh was informed that low vitamin D levels contributes to fatigue and are associated with obesity, breast, and colon cancer. Kristi Huynh agrees to change prescription Vit D to @50 ,000 IU every 14 days, does not need a refill.She will follow up for routine testing of vitamin D, at least 2-3 times per year. She was informed of the risk of over-replacement of vitamin D and agrees to not increase her dose unless she discusses this with [name] first. Kristi Huynh agrees to follow up with our clinic in 3 weeks.  I spent > than 50% of the 15 minute visit on counseling as documented in the note.  Obesity Kristi Huynh is currently in the action stage of change. As such, her goal is to continue with weight loss efforts She has agreed to follow the Category 2 plan Kristi Huynh has been instructed to work up to a goal of 150 minutes of combined cardio and strengthening exercise per week for weight loss and overall health benefits. We discussed the following Behavioral Modification Strategies today: work on meal planning and easy cooking plans and holiday eating strategies    Kristi Huynh has agreed to follow up with our clinic in 3 weeks. She was informed of the importance of frequent follow up visits to maximize her success with intensive lifestyle modifications for her multiple health conditions.   OBESITY BEHAVIORAL INTERVENTION VISIT  Today's visit was # 32   Starting weight: 205 lbs Starting date: 10/09/16 Today's weight : 193 lbs Today's date: 09/07/2018 Total lbs lost to date: 12    ASK: We discussed the diagnosis of obesity with Kristi Huynh today and Kristi Huynh  agreed to give [name] permission to discuss obesity behavioral modification therapy today.  ASSESS: Sandhya has the diagnosis of obesity and her BMI today is 34.2 Adeli is in the action stage of change   ADVISE: Maison was educated on the multiple health risks of obesity as well as the benefit of weight loss to improve her health. She was advised of the need for long term treatment and the importance of lifestyle modifications.  AGREE: Multiple dietary modification options and treatment options were discussed and  Cailin agreed to the above obesity treatment plan.  Wilhemena Durie, am acting as transcriptionist for Abby Potash, PA-C I, Abby Potash, PA-C have reviewed above note and agree with its content

## 2018-09-21 DIAGNOSIS — D485 Neoplasm of uncertain behavior of skin: Secondary | ICD-10-CM | POA: Diagnosis not present

## 2018-09-21 DIAGNOSIS — L820 Inflamed seborrheic keratosis: Secondary | ICD-10-CM | POA: Diagnosis not present

## 2018-09-28 ENCOUNTER — Ambulatory Visit (INDEPENDENT_AMBULATORY_CARE_PROVIDER_SITE_OTHER): Payer: 59 | Admitting: Physician Assistant

## 2018-10-14 ENCOUNTER — Encounter (INDEPENDENT_AMBULATORY_CARE_PROVIDER_SITE_OTHER): Payer: Self-pay | Admitting: Physician Assistant

## 2018-10-14 ENCOUNTER — Ambulatory Visit (INDEPENDENT_AMBULATORY_CARE_PROVIDER_SITE_OTHER): Payer: 59 | Admitting: Physician Assistant

## 2018-10-14 VITALS — BP 148/86 | HR 79 | Temp 98.2°F | Ht 63.0 in | Wt 197.0 lb

## 2018-10-14 DIAGNOSIS — Z6835 Body mass index (BMI) 35.0-35.9, adult: Secondary | ICD-10-CM | POA: Diagnosis not present

## 2018-10-14 DIAGNOSIS — E559 Vitamin D deficiency, unspecified: Secondary | ICD-10-CM

## 2018-10-14 DIAGNOSIS — R7303 Prediabetes: Secondary | ICD-10-CM

## 2018-10-14 DIAGNOSIS — Z9189 Other specified personal risk factors, not elsewhere classified: Secondary | ICD-10-CM

## 2018-10-14 MED ORDER — VITAMIN D (ERGOCALCIFEROL) 1.25 MG (50000 UNIT) PO CAPS: 50000.0000 [IU] | ORAL_CAPSULE | ORAL | 0 refills | Status: DC

## 2018-10-14 MED FILL — VIT D2 1.25 MG (50,000 UNIT: 1.25 MG | 28 days supply | Qty: 4 | Fill #0

## 2018-10-14 NOTE — Progress Notes (Signed)
Office: [phone]  /  Fax: [phone]   HPI:   Chief Complaint: OBESITY Kristi Huynh is here to discuss her progress with her obesity treatment plan. She is on the Category 2 plan and is following her eating plan approximately 50 % of the time. She states she is walking for 60 minutes 3 times per week. Kristi Huynh struggled to follow the plan due to holiday eating. She has been sick with an upper respiratory infection. She is ready to get back on track.  Her weight is 197 lb (89.4 kg) today and has gained 4 pounds since her last visit. She has lost 8 lbs since starting treatment with [name].  Vitamin D Deficiency Kristi Huynh has a diagnosis of vitamin D deficiency. She is currently taking prescription Vit D and denies nausea, vomiting or muscle weakness.  At risk for osteopenia and osteoporosis Kristi Huynh is at higher risk of osteopenia and osteoporosis due to vitamin D deficiency.   Pre-Diabetes Kristi Huynh has a diagnosis of pre-diabetes based on her elevated Hgb A1c and was informed this puts her at greater risk of developing diabetes. She is on metformin and denies nausea, vomiting, or diarrhea. She continues to work on diet and exercise to decrease risk of diabetes. She denies polyphagia or hypoglycemia.  ALLERGIES: Allergies  Allergen Reactions  . Codeine   . Oxycodone Hcl   . Penicillins   . Sulfonamide Derivatives     MEDICATIONS: Current Outpatient Medications on File Prior to Visit  Medication Sig Dispense Refill  . Ascorbic Acid (VITA-C PO) Take 2 tablets by mouth every morning.    . hydrochlorothiazide (HYDRODIURIL) 12.5 MG tablet Take 1 tablet (12.5 mg total) by mouth daily. 30 tablet 0  . Loratadine-Pseudoephedrine (HCA ALLERGY/NASAL DECONGESTANT PO) Take 1 tablet by mouth every morning.    . Melatonin 10 MG TABS Take 1 tablet by mouth at bedtime.    . metFORMIN (GLUCOPHAGE) 500 MG tablet Take 1 tablet (500 mg total) by mouth 2 (two) times daily with a meal. 60 tablet 0  . Misc Natural  Products (ENERGY FOCUS) TABS Take 1 tablet by mouth every morning.    . Multiple Vitamin (MULTIVITAMIN) tablet Take 1 tablet by mouth every morning.    . Omega-3 Fatty Acids (FISH OIL) 1000 MG CAPS Take 2 capsules by mouth every morning.     . polyethylene glycol powder (GLYCOLAX/MIRALAX) powder Take 17 g by mouth every morning. 3350 g 0  . Probiotic Product (PROBIOTIC-10 PO) Take 1 tablet by mouth every morning.    [name] 150 MG TABS Take 4 tablets by mouth every morning.    . vitamin B-12 (CYANOCOBALAMIN) 1000 MCG tablet Take 1,000 mcg by mouth daily.     No current facility-administered medications on file prior to visit.     PAST MEDICAL HISTORY: Past Medical History:  Diagnosis Date  . Anxiety   . back pain   . Depression   . joint pain   . Swelling    feet and legs    PAST SURGICAL HISTORY: Past Surgical History:  Procedure Laterality Date  . ANKLE SURGERY     Right 2 times, once as teen and once at age 45  . CYST REMOVAL HAND     Right hand age 1 and 38  . LAPAROSCOPY     endometrosis  . TONSILLECTOMY AND ADENOIDECTOMY     53 years old    SOCIAL HISTORY: Social History   Tobacco Use  . Smoking status: Never Smoker  .  Smokeless tobacco: Never Used  Substance Use Topics  . Alcohol use: Not on file  . Drug use: Not on file    FAMILY HISTORY: Family History  Problem Relation Age of Onset  . Diabetes Mother   . Hypertension Mother   . Hyperlipidemia Mother   . Thyroid disease Mother   . Depression Mother   . Anxiety disorder Mother   . Eating disorder Mother   . Obesity Mother   . Heart disease Father   . Sudden death Father     ROS: Review of Systems  Constitutional: Negative for weight loss.  Gastrointestinal: Negative for diarrhea, nausea and vomiting.  Musculoskeletal:       Negative muscle weakness  Endo/Heme/Allergies:       Negative polyphagia Negative hypoglycemia    PHYSICAL EXAM: Blood pressure (!) 148/86, pulse 79,  temperature 98.2 F (36.8 C), temperature source Oral, height 5\' 3"  (1.6 m), weight 197 lb (89.4 kg), SpO2 99 %. Body mass index is 34.9 kg/m. Physical Exam Vitals signs reviewed.  Constitutional:      Appearance: Normal appearance. She is obese.  Cardiovascular:     Rate and Rhythm: Normal rate.     Pulses: Normal pulses.  Pulmonary:     Effort: Pulmonary effort is normal.  Musculoskeletal: Normal range of motion.  Skin:    General: Skin is warm and dry.  Neurological:     Mental Status: She is alert and oriented to person, place, and time.  Psychiatric:        Mood and Affect: Mood normal.        Behavior: Behavior normal.     RECENT LABS AND TESTS: BMET    Component Value Date/Time   NA 139 08/17/2018 0833   K 4.2 08/17/2018 0833   CL 100 08/17/2018 0833   CO2 24 08/17/2018 0833   GLUCOSE 98 08/17/2018 0833   GLUCOSE 93 12/03/2009 2056   BUN 12 08/17/2018 0833   CREATININE 0.87 08/17/2018 0833   CALCIUM 9.5 08/17/2018 0833   GFRNONAA 76 08/17/2018 0833   GFRAA 87 08/17/2018 0833   Lab Results  Component Value Date   HGBA1C 5.5 08/17/2018   HGBA1C 5.5 04/21/2018   HGBA1C 5.6 03/11/2017   HGBA1C 5.4 10/09/2016   Lab Results  Component Value Date   INSULIN 23.8 08/17/2018   INSULIN 18.9 04/21/2018   INSULIN 21.3 03/11/2017   INSULIN 14.4 10/09/2016   CBC    Component Value Date/Time   WBC 8.8 10/09/2016 1442   WBC 6.2 10/24/2008 2117   RBC 4.84 10/09/2016 1442   RBC 4.64 10/24/2008 2117   HGB 14.4 10/09/2016 1442   HCT 42.4 10/09/2016 1442   PLT 252 10/24/2008 2117   MCV 88 10/09/2016 1442   MCH 29.8 10/09/2016 1442   MCHC 34.0 10/09/2016 1442   MCHC 32.6 10/24/2008 2117   RDW 13.7 10/09/2016 1442   LYMPHSABS 2.8 10/09/2016 1442   EOSABS 0.1 10/09/2016 1442   BASOSABS 0.1 10/09/2016 1442   Iron/TIBC/Ferritin/ %Sat No results found for: IRON, TIBC, FERRITIN, IRONPCTSAT Lipid Panel     Component Value Date/Time   CHOL 222 (H) 08/17/2018 0833    TRIG 129 08/17/2018 0833   HDL 40 08/17/2018 0833   CHOLHDL 5.2 (H) 04/21/2018 0733   CHOLHDL 4.7 Ratio 12/03/2009 2056   VLDL 27 12/03/2009 2056   LDLCALC 156 (H) 08/17/2018 0833   Hepatic Function Panel     Component Value Date/Time   PROT 7.5 08/17/2018  0109   ALBUMIN 4.6 08/17/2018 0833   AST 21 08/17/2018 0833   ALT 14 08/17/2018 0833   ALKPHOS 57 08/17/2018 0833   BILITOT 0.4 08/17/2018 0833      Component Value Date/Time   TSH 1.970 10/09/2016 1442   TSH 1.990 12/03/2009 2056   TSH 2.142 10/24/2008 2117    ASSESSMENT AND PLAN: Vitamin D deficiency - Plan: Vitamin D, Ergocalciferol, (DRISDOL) 1.25 MG (50000 UT) CAPS capsule  Prediabetes  At risk for osteoporosis  Class 2 severe obesity with serious comorbidity and body mass index (BMI) of 35.0 to 35.9 in adult, unspecified obesity type ([place])  PLAN:  Vitamin D Deficiency Kristi Huynh was informed that low vitamin D levels contributes to fatigue and are associated with obesity, breast, and colon cancer. Kristi Huynh agrees to continue taking prescription Vit D @50 ,000 IU every week #4 and we will refill for 1 month. She will follow up for routine testing of vitamin D, at least 2-3 times per year. She was informed of the risk of over-replacement of vitamin D and agrees to not increase her dose unless she discusses this with [name] first. Kristi Huynh agrees to follow up with our clinic in 3 weeks.  At risk for osteopenia and osteoporosis Kristi Huynh was given extended (15 minutes) osteoporosis prevention counseling today. Kristi Huynh is at risk for osteopenia and osteoporsis due to her vitamin D deficiency. She was encouraged to take her vitamin D and follow her higher calcium diet and increase strengthening exercise to help strengthen her bones and decrease her risk of osteopenia and osteoporosis.  Pre-Diabetes Kristi Huynh will continue to work on weight loss, exercise, and decreasing simple carbohydrates in her diet to help decrease the risk of diabetes. We  dicussed metformin including benefits and risks. She was informed that eating too many simple carbohydrates or too many calories at one sitting increases the likelihood of GI side effects. Kristi Huynh agrees to continue taking metformin and she agrees to follow up with our clinic in 3 weeks as directed to monitor her progress.  Obesity Kristi Huynh is currently in the action stage of change. As such, her goal is to continue with weight loss efforts She has agreed to follow the Category 2 plan Kristi Huynh has been instructed to work up to a goal of 150 minutes of combined cardio and strengthening exercise per week for weight loss and overall health benefits. We discussed the following Behavioral Modification Strategies today: work on meal planning and easy cooking plans and keeping healthy foods in the home   Kristi Huynh has agreed to follow up with our clinic in 3 weeks. She was informed of the importance of frequent follow up visits to maximize her success with intensive lifestyle modifications for her multiple health conditions.   OBESITY BEHAVIORAL INTERVENTION VISIT  Today's visit was # 33  Starting weight: 205 lbs Starting date: 10/09/16 Today's weight : 197 lbs Today's date: 10/14/2018 Total lbs lost to date: 8    ASK: We discussed the diagnosis of obesity with Kristi Huynh today and Kristi Huynh agreed to give [name] permission to discuss obesity behavioral modification therapy today.  ASSESS: Kristi Huynh has the diagnosis of obesity and her BMI today is 34.91 Kristi Huynh is in the action stage of change   ADVISE: Kristi Huynh was educated on the multiple health risks of obesity as well as the benefit of weight loss to improve her health. She was advised of the need for long term treatment and the importance of lifestyle modifications.  AGREE: Multiple dietary modification options  and treatment options were discussed and  Kristi Huynh agreed to the above obesity treatment plan.  Kristi Huynh, am acting as transcriptionist for  Abby Potash, PA-C I, Abby Potash, PA-C have reviewed above note and agree with its content

## 2018-11-04 ENCOUNTER — Encounter (INDEPENDENT_AMBULATORY_CARE_PROVIDER_SITE_OTHER): Payer: Self-pay | Admitting: Physician Assistant

## 2018-11-04 ENCOUNTER — Ambulatory Visit (INDEPENDENT_AMBULATORY_CARE_PROVIDER_SITE_OTHER): Payer: 59 | Admitting: Physician Assistant

## 2018-11-04 VITALS — BP 127/76 | HR 69 | Temp 98.1°F | Ht 63.0 in | Wt 197.0 lb

## 2018-11-04 DIAGNOSIS — Z6835 Body mass index (BMI) 35.0-35.9, adult: Secondary | ICD-10-CM | POA: Diagnosis not present

## 2018-11-04 DIAGNOSIS — Z9189 Other specified personal risk factors, not elsewhere classified: Secondary | ICD-10-CM

## 2018-11-04 DIAGNOSIS — R7303 Prediabetes: Secondary | ICD-10-CM

## 2018-11-04 DIAGNOSIS — E7849 Other hyperlipidemia: Secondary | ICD-10-CM

## 2018-11-04 MED ORDER — METFORMIN HCL 500 MG PO TABS: 500.0000 mg | ORAL_TABLET | Freq: Two times a day (BID) | ORAL | 0 refills | Status: DC

## 2018-11-04 MED FILL — metFORMIN HCL 500 MG TABS: 500 | 30 days supply | Qty: 60 | Fill #0

## 2018-11-04 NOTE — Progress Notes (Signed)
Office: [phone]  /  Fax: [phone]   HPI:   Chief Complaint: OBESITY Kristi Huynh is here to discuss her progress with her obesity treatment plan. She is on the Category 2 plan and is following her eating plan approximately 75 % of the time. She states she is going to the gym 90 minutes 3 times per week. Kristi Huynh reports that she is not getting enough protein throughout the day. She continues to struggle with family difficulties.  Her weight is 197 lb (89.4 kg) today and has lost 0 lbs since her last visit. She has lost 8 lbs since starting treatment with [name].  Pre-Diabetes Kristi Huynh has a diagnosis of prediabetes based on her elevated HgA1c and was informed this puts her at greater risk of developing diabetes. She is taking metformin currently and continues to work on diet and exercise to decrease risk of diabetes. She denies nausea or hypoglycemia.  Hyperlipidemia Kristi Huynh has hyperlipidemia and has been trying to improve her cholesterol levels with intensive lifestyle modification including a low saturated fat diet, exercise and weight loss. She denies any chest pain. She is currently taking fish oil.  At risk for diabetes Kristi Huynh is at higher than averagerisk for developing diabetes due to her obesity. She currently denies polyuria or polydipsia.  ASSESSMENT AND PLAN:  Prediabetes - Plan: metFORMIN (GLUCOPHAGE) 500 MG tablet  Other hyperlipidemia  At risk for diabetes mellitus  Class 2 severe obesity with serious comorbidity and body mass index (BMI) of 35.0 to 35.9 in adult, unspecified obesity type ([place])  PLAN:  Pre-Diabetes Kristi Huynh will continue to work on weight loss, exercise, and decreasing simple carbohydrates in her diet to help decrease the risk of diabetes. We dicussed metformin including benefits and risks. She was informed that eating too many simple carbohydrates or too many calories at one sitting increases the likelihood of GI side effects. Kristi Huynh has agreed to continue taking  metformin 500 mg bid #60 with no refills for now and a prescription was written today. Kristi Huynh agreed to follow up with our clinic in 3 weeks  Hyperlipidemia Kristi Huynh was informed of the American Heart Association Guidelines emphasizing intensive lifestyle modifications as the first line treatment for hyperlipidemia. We discussed many lifestyle modifications today in depth, and Kristi Huynh will continue to work on decreasing saturated fats such as fatty red meat, butter and many fried foods. She will also increase vegetables and lean protein in her diet and continue to work on exercise and weight loss efforts. She denies chest pain. Kristi Huynh agrees to continue with weight loss and taking fish oil daily. Kristi Huynh agrees to follow up with our clinic in 3 weeks.  Diabetes risk counselling Kristi Huynh was given extended (15 minutes) diabetes prevention counseling today. She is 53 y.o. female and has risk factors for diabetes including obesity. We discussed intensive lifestyle modifications today with an emphasis on weight loss as well as increasing exercise and decreasing simple carbohydrates in her diet.  Obesity Kristi Huynh is currently in the action stage of change. As such, her goal is to continue with weight loss efforts She has agreed to follow the Category 2 plan Kristi Huynh has been instructed to work up to a goal of 150 minutes of combined cardio and strengthening exercise per week for weight loss and overall health benefits. We discussed the following Behavioral Modification Stratagies today: increasing lean protein intake and work on meal planning and easy cooking plans  Kristi Huynh has agreed to follow up with our clinic in 3 weeks. She was informed  of the importance of frequent follow up visits to maximize her success with intensive lifestyle modifications for her multiple health conditions.  ALLERGIES: Allergies  Allergen Reactions  . Codeine   . Oxycodone Hcl   . Penicillins   . Sulfonamide Derivatives      MEDICATIONS: Current Outpatient Medications on File Prior to Visit  Medication Sig Dispense Refill  . Ascorbic Acid (VITA-C PO) Take 2 tablets by mouth every morning.    . hydrochlorothiazide (HYDRODIURIL) 12.5 MG tablet Take 1 tablet (12.5 mg total) by mouth daily. 30 tablet 0  . Loratadine-Pseudoephedrine (HCA ALLERGY/NASAL DECONGESTANT PO) Take 1 tablet by mouth every morning.    . Melatonin 10 MG TABS Take 1 tablet by mouth at bedtime.    . Misc Natural Products (ENERGY FOCUS) TABS Take 1 tablet by mouth every morning.    . Multiple Vitamin (MULTIVITAMIN) tablet Take 1 tablet by mouth every morning.    . Omega-3 Fatty Acids (FISH OIL) 1000 MG CAPS Take 2 capsules by mouth every morning.     . polyethylene glycol powder (GLYCOLAX/MIRALAX) powder Take 17 g by mouth every morning. 3350 g 0  . Probiotic Product (PROBIOTIC-10 PO) Take 1 tablet by mouth every morning.    Kristi Huynh [name] 150 MG TABS Take 4 tablets by mouth every morning.    . vitamin B-12 (CYANOCOBALAMIN) 1000 MCG tablet Take 1,000 mcg by mouth daily.    . Vitamin D, Ergocalciferol, (DRISDOL) 1.25 MG (50000 UT) CAPS capsule Take 1 capsule (50,000 Units total) by mouth every 7 (seven) days. 4 capsule 0   No current facility-administered medications on file prior to visit.     PAST MEDICAL HISTORY: Past Medical History:  Diagnosis Date  . Anxiety   . back pain   . Depression   . joint pain   . Swelling    feet and legs    PAST SURGICAL HISTORY: Past Surgical History:  Procedure Laterality Date  . ANKLE SURGERY     Right 2 times, once as teen and once at age 1  . CYST REMOVAL HAND     Right hand age 27 and 48  . LAPAROSCOPY     endometrosis  . TONSILLECTOMY AND ADENOIDECTOMY     53 years old    SOCIAL HISTORY: Social History   Tobacco Use  . Smoking status: Never Smoker  . Smokeless tobacco: Never Used  Substance Use Topics  . Alcohol use: Not on file  . Drug use: Not on file    FAMILY  HISTORY: Family History  Problem Relation Age of Onset  . Diabetes Mother   . Hypertension Mother   . Hyperlipidemia Mother   . Thyroid disease Mother   . Depression Mother   . Anxiety disorder Mother   . Eating disorder Mother   . Obesity Mother   . Heart disease Father   . Sudden death Father     ROS: Review of Systems  Constitutional: Negative for weight loss.  Cardiovascular: Negative for chest pain.  Gastrointestinal: Negative for nausea and vomiting.  Genitourinary:       Negative for polyuria  Endo/Heme/Allergies: Negative for polydipsia.       Negative for hypoglycemia Negative for polyphagia    PHYSICAL EXAM: Blood pressure 127/76, pulse 69, temperature 98.1 F (36.7 C), temperature source Oral, height 5\' 3"  (1.6 m), weight 197 lb (89.4 kg), SpO2 98 %. Body mass index is 34.9 kg/m. Physical Exam Vitals signs reviewed.  Constitutional:  Appearance: Normal appearance. She is obese.  Cardiovascular:     Rate and Rhythm: Normal rate.     Pulses: Normal pulses.  Pulmonary:     Effort: Pulmonary effort is normal.  Musculoskeletal: Normal range of motion.  Skin:    General: Skin is warm and dry.  Neurological:     Mental Status: She is alert and oriented to person, place, and time.  Psychiatric:        Mood and Affect: Mood normal.        Behavior: Behavior normal.     RECENT LABS AND TESTS: BMET    Component Value Date/Time   NA 139 08/17/2018 0833   K 4.2 08/17/2018 0833   CL 100 08/17/2018 0833   CO2 24 08/17/2018 0833   GLUCOSE 98 08/17/2018 0833   GLUCOSE 93 12/03/2009 2056   BUN 12 08/17/2018 0833   CREATININE 0.87 08/17/2018 0833   CALCIUM 9.5 08/17/2018 0833   GFRNONAA 76 08/17/2018 0833   GFRAA 87 08/17/2018 0833   Lab Results  Component Value Date   HGBA1C 5.5 08/17/2018   HGBA1C 5.5 04/21/2018   HGBA1C 5.6 03/11/2017   HGBA1C 5.4 10/09/2016   Lab Results  Component Value Date   INSULIN 23.8 08/17/2018   INSULIN 18.9  04/21/2018   INSULIN 21.3 03/11/2017   INSULIN 14.4 10/09/2016   CBC    Component Value Date/Time   WBC 8.8 10/09/2016 1442   WBC 6.2 10/24/2008 2117   RBC 4.84 10/09/2016 1442   RBC 4.64 10/24/2008 2117   HGB 14.4 10/09/2016 1442   HCT 42.4 10/09/2016 1442   PLT 252 10/24/2008 2117   MCV 88 10/09/2016 1442   MCH 29.8 10/09/2016 1442   MCHC 34.0 10/09/2016 1442   MCHC 32.6 10/24/2008 2117   RDW 13.7 10/09/2016 1442   LYMPHSABS 2.8 10/09/2016 1442   EOSABS 0.1 10/09/2016 1442   BASOSABS 0.1 10/09/2016 1442   Iron/TIBC/Ferritin/ %Sat No results found for: IRON, TIBC, FERRITIN, IRONPCTSAT Lipid Panel     Component Value Date/Time   CHOL 222 (H) 08/17/2018 0833   TRIG 129 08/17/2018 0833   HDL 40 08/17/2018 0833   CHOLHDL 5.2 (H) 04/21/2018 0733   CHOLHDL 4.7 Ratio 12/03/2009 2056   VLDL 27 12/03/2009 2056   LDLCALC 156 (H) 08/17/2018 0833   Hepatic Function Panel     Component Value Date/Time   PROT 7.5 08/17/2018 0833   ALBUMIN 4.6 08/17/2018 0833   AST 21 08/17/2018 0833   ALT 14 08/17/2018 0833   ALKPHOS 57 08/17/2018 0833   BILITOT 0.4 08/17/2018 0833      Component Value Date/Time   TSH 1.970 10/09/2016 1442   TSH 1.990 12/03/2009 2056   TSH 2.142 10/24/2008 2117      OBESITY BEHAVIORAL INTERVENTION VISIT  Today's visit was # 34th   Starting weight: 205 lbs Starting date: 10/09/2016 Today's weight : Weight: 197 lb (89.4 kg)  Today's date: 11/04/2018 Total lbs lost to date: 8   ASK: We discussed the diagnosis of obesity with [name] today and Bay agreed to give [name] permission to discuss obesity behavioral modification therapy today.  ASSESS: Margart has the diagnosis of obesity and her BMI today is 34.91 Lilyauna is in the action stage of change   ADVISE: Kameisha was educated on the multiple health risks of obesity as well as the benefit of weight loss to improve her health. She was advised of the need for long term treatment and  the  importance of lifestyle modifications to improve her current health and to decrease her risk of future health problems.  AGREE: Multiple dietary modification options and treatment options were discussed and  Madell agreed to follow the recommendations documented in the above note.  ARRANGE: Mionna was educated on the importance of frequent visits to treat obesity as outlined per CMS and USPSTF guidelines and agreed to schedule her next follow up appointment today.  I, Tammy Wysor, am acting as Location manager for [employer] I, Abby Potash, PA-C have reviewed above note and agree with its content

## 2018-11-25 ENCOUNTER — Encounter (INDEPENDENT_AMBULATORY_CARE_PROVIDER_SITE_OTHER): Payer: Self-pay | Admitting: Physician Assistant

## 2018-11-25 ENCOUNTER — Ambulatory Visit (INDEPENDENT_AMBULATORY_CARE_PROVIDER_SITE_OTHER): Payer: 59 | Admitting: Physician Assistant

## 2018-11-25 VITALS — BP 148/80 | HR 84 | Ht 63.0 in | Wt 200.0 lb

## 2018-11-25 DIAGNOSIS — Z6835 Body mass index (BMI) 35.0-35.9, adult: Secondary | ICD-10-CM | POA: Diagnosis not present

## 2018-11-25 DIAGNOSIS — E559 Vitamin D deficiency, unspecified: Secondary | ICD-10-CM

## 2018-11-25 DIAGNOSIS — Z9189 Other specified personal risk factors, not elsewhere classified: Secondary | ICD-10-CM

## 2018-11-25 DIAGNOSIS — E7849 Other hyperlipidemia: Secondary | ICD-10-CM | POA: Diagnosis not present

## 2018-11-25 MED ORDER — VITAMIN D (ERGOCALCIFEROL) 1.25 MG (50000 UNIT) PO CAPS: 50000.0000 [IU] | ORAL_CAPSULE | ORAL | 0 refills | Status: DC

## 2018-11-25 NOTE — Progress Notes (Signed)
Office: [phone]  /  Fax: [phone]   HPI:   Chief Complaint: OBESITY Kristi Huynh is here to discuss her progress with her obesity treatment plan. She is on the Category 2 plan and is following her eating plan approximately 75 % of the time. She states she is going to the gym doing elliptical, circuit, bike and treadmill 90 minutes 3 times per week. Kristi Huynh is frustrated with the lack of weight loss, as she has been working out 3 times a week. She has been skipping her snacks. Her weight is 200 lb (90.7 kg) today and has gained 3 lbs since her last visit. She has lost 5 lbs since starting treatment with [name].  Vitamin D deficiency Kristi Huynh has a diagnosis of vitamin D deficiency. She is currently taking Vit D and denies nausea, vomiting or muscle weakness.  Hyperlipidemia Kristi Huynh has hyperlipidemia and has been trying to improve her cholesterol levels with intensive lifestyle modification including a low saturated fat diet, exercise and weight loss. She denies any chest pain. She is taking OTC fish oil.  At risk for cardiovascular disease Kristi Huynh is at a higher than average risk for cardiovascular disease due to obesity. She currently denies any chest pain.  ASSESSMENT AND PLAN:  Vitamin D deficiency - Plan: Vitamin D, Ergocalciferol, (DRISDOL) 1.25 MG (50000 UT) CAPS capsule  Other hyperlipidemia  At risk for heart disease  Class 2 severe obesity with serious comorbidity and body mass index (BMI) of 35.0 to 35.9 in adult, unspecified obesity type ([place])  PLAN:  Vitamin D Deficiency Kristi Huynh was informed that low vitamin D levels contributes to fatigue and are associated with obesity, breast, and colon cancer. She agrees to continue to take prescription Vit D 50,000 IU every week #4 with no refills and will follow up for routine testing of vitamin D, at least 2-3 times per year. She was informed of the risk of over-replacement of vitamin D and agrees to not increase her dose unless she  discusses this with [name] first. Kristi Huynh agrees to follow up with our clinic in 2 weeks.  Hyperlipidemia Kristi Huynh was informed of the American Heart Association Guidelines emphasizing intensive lifestyle modifications as the first line treatment for hyperlipidemia. We discussed many lifestyle modifications today in depth, and Kristi Huynh will continue to work on decreasing saturated fats such as fatty red meat, butter and many fried foods. She will also increase vegetables and lean protein in her diet and continue to work on exercise and weight loss efforts. Kristi Huynh agrees to continue taking her medication and OTC fish oil. Kristi Huynh agrees to follow up with our clinic in 2 weeks.  Cardiovascular risk counseling Kristi Huynh was given extended (15 minutes) coronary artery disease prevention counseling today. She is 53 y.o. female and has risk factors for heart disease including obesity. We discussed intensive lifestyle modifications today with an emphasis on specific weight loss instructions and strategies. Pt was also informed of the importance of increasing exercise and decreasing saturated fats to help prevent heart disease.  Obesity Kristi Huynh is currently in the action stage of change. As such, her goal is to continue with weight loss efforts She has agreed to follow the Category 2 plan Kristi Huynh has been instructed to work up to a goal of 150 minutes of combined cardio and strengthening exercise per week for weight loss and overall health benefits. We discussed the following Behavioral Modification Strategies today: work on meal planning and easy cooking plans and planning for success. We will check IC at next  visit if needed. Kristi Huynh has agreed to follow up with our clinic in 2 weeks. She was informed of the importance of frequent follow up visits to maximize her success with intensive lifestyle modifications for her multiple health conditions.  ALLERGIES: Allergies  Allergen Reactions  . Codeine   . Oxycodone Hcl   .  Penicillins   . Sulfonamide Derivatives     MEDICATIONS: Current Outpatient Medications on File Prior to Visit  Medication Sig Dispense Refill  . Ascorbic Acid (VITA-C PO) Take 2 tablets by mouth every morning.    . hydrochlorothiazide (HYDRODIURIL) 12.5 MG tablet Take 1 tablet (12.5 mg total) by mouth daily. 30 tablet 0  . Loratadine-Pseudoephedrine (HCA ALLERGY/NASAL DECONGESTANT PO) Take 1 tablet by mouth every morning.    . Melatonin 10 MG TABS Take 1 tablet by mouth at bedtime.    . metFORMIN (GLUCOPHAGE) 500 MG tablet Take 1 tablet (500 mg total) by mouth 2 (two) times daily with a meal. 60 tablet 0  . Misc Natural Products (ENERGY FOCUS) TABS Take 1 tablet by mouth every morning.    . Multiple Vitamin (MULTIVITAMIN) tablet Take 1 tablet by mouth every morning.    . Omega-3 Fatty Acids (FISH OIL) 1000 MG CAPS Take 2 capsules by mouth every morning.     . polyethylene glycol powder (GLYCOLAX/MIRALAX) powder Take 17 g by mouth every morning. 3350 g 0  . Probiotic Product (PROBIOTIC-10 PO) Take 1 tablet by mouth every morning.    Kristi Huynh [name] 150 MG TABS Take 4 tablets by mouth every morning.    . vitamin B-12 (CYANOCOBALAMIN) 1000 MCG tablet Take 1,000 mcg by mouth daily.    . Vitamin D, Ergocalciferol, (DRISDOL) 1.25 MG (50000 UT) CAPS capsule Take 1 capsule (50,000 Units total) by mouth every 7 (seven) days. 4 capsule 0   No current facility-administered medications on file prior to visit.     PAST MEDICAL HISTORY: Past Medical History:  Diagnosis Date  . Anxiety   . back pain   . Depression   . joint pain   . Swelling    feet and legs    PAST SURGICAL HISTORY: Past Surgical History:  Procedure Laterality Date  . ANKLE SURGERY     Right 2 times, once as teen and once at age 87  . CYST REMOVAL HAND     Right hand age 75 and 46  . LAPAROSCOPY     endometrosis  . TONSILLECTOMY AND ADENOIDECTOMY     53 years old    SOCIAL HISTORY: Social History   Tobacco Use    . Smoking status: Never Smoker  . Smokeless tobacco: Never Used  Substance Use Topics  . Alcohol use: Not on file  . Drug use: Not on file    FAMILY HISTORY: Family History  Problem Relation Age of Onset  . Diabetes Mother   . Hypertension Mother   . Hyperlipidemia Mother   . Thyroid disease Mother   . Depression Mother   . Anxiety disorder Mother   . Eating disorder Mother   . Obesity Mother   . Heart disease Father   . Sudden death Father     ROS: Review of Systems  Constitutional: Negative for weight loss.  Cardiovascular: Negative for chest pain.  Gastrointestinal: Negative for nausea and vomiting.  Musculoskeletal:       Negative for muscle weakness    PHYSICAL EXAM: Blood pressure (!) 148/80, pulse 84, height 5\' 3"  (1.6 m), weight 200  lb (90.7 kg), SpO2 99 %. Body mass index is 35.43 kg/m. Physical Exam Vitals signs reviewed.  Constitutional:      Appearance: Normal appearance. She is obese.  Cardiovascular:     Rate and Rhythm: Normal rate.     Pulses: Normal pulses.  Pulmonary:     Effort: Pulmonary effort is normal.  Musculoskeletal: Normal range of motion.  Skin:    General: Skin is warm and dry.  Neurological:     Mental Status: She is alert and oriented to person, place, and time.  Psychiatric:        Mood and Affect: Mood normal.        Behavior: Behavior normal.     RECENT LABS AND TESTS: BMET    Component Value Date/Time   NA 139 08/17/2018 0833   K 4.2 08/17/2018 0833   CL 100 08/17/2018 0833   CO2 24 08/17/2018 0833   GLUCOSE 98 08/17/2018 0833   GLUCOSE 93 12/03/2009 2056   BUN 12 08/17/2018 0833   CREATININE 0.87 08/17/2018 0833   CALCIUM 9.5 08/17/2018 0833   GFRNONAA 76 08/17/2018 0833   GFRAA 87 08/17/2018 0833   Lab Results  Component Value Date   HGBA1C 5.5 08/17/2018   HGBA1C 5.5 04/21/2018   HGBA1C 5.6 03/11/2017   HGBA1C 5.4 10/09/2016   Lab Results  Component Value Date   INSULIN 23.8 08/17/2018   INSULIN  18.9 04/21/2018   INSULIN 21.3 03/11/2017   INSULIN 14.4 10/09/2016   CBC    Component Value Date/Time   WBC 8.8 10/09/2016 1442   WBC 6.2 10/24/2008 2117   RBC 4.84 10/09/2016 1442   RBC 4.64 10/24/2008 2117   HGB 14.4 10/09/2016 1442   HCT 42.4 10/09/2016 1442   PLT 252 10/24/2008 2117   MCV 88 10/09/2016 1442   MCH 29.8 10/09/2016 1442   MCHC 34.0 10/09/2016 1442   MCHC 32.6 10/24/2008 2117   RDW 13.7 10/09/2016 1442   LYMPHSABS 2.8 10/09/2016 1442   EOSABS 0.1 10/09/2016 1442   BASOSABS 0.1 10/09/2016 1442   Iron/TIBC/Ferritin/ %Sat No results found for: IRON, TIBC, FERRITIN, IRONPCTSAT Lipid Panel     Component Value Date/Time   CHOL 222 (H) 08/17/2018 0833   TRIG 129 08/17/2018 0833   HDL 40 08/17/2018 0833   CHOLHDL 5.2 (H) 04/21/2018 0733   CHOLHDL 4.7 Ratio 12/03/2009 2056   VLDL 27 12/03/2009 2056   LDLCALC 156 (H) 08/17/2018 0833   Hepatic Function Panel     Component Value Date/Time   PROT 7.5 08/17/2018 0833   ALBUMIN 4.6 08/17/2018 0833   AST 21 08/17/2018 0833   ALT 14 08/17/2018 0833   ALKPHOS 57 08/17/2018 0833   BILITOT 0.4 08/17/2018 0833      Component Value Date/Time   TSH 1.970 10/09/2016 1442   TSH 1.990 12/03/2009 2056   TSH 2.142 10/24/2008 2117     Ref. Range 08/17/2018 08:33  Vitamin D, 25-Hydroxy Latest Ref Range: 30.0 - 100.0 ng/mL 53.4     OBESITY BEHAVIORAL INTERVENTION VISIT  Today's visit was # 36   Starting weight: 205 lbs Starting date: 10/09/2016 Today's weight :: 200 lbs Today's date: 11/25/2018 Total lbs lost to date: 5   ASK: We discussed the diagnosis of obesity with Kristi Huynh today and Kristi Huynh agreed to give [name] permission to discuss obesity behavioral modification therapy today.  ASSESS: Kristi Huynh has the diagnosis of obesity and her BMI today is 35.44 Kristi Huynh is in the action  stage of change   ADVISE: Kristi Huynh was educated on the multiple health risks of obesity as well as the benefit of weight loss to  improve her health. She was advised of the need for long term treatment and the importance of lifestyle modifications to improve her current health and to decrease her risk of future health problems.  AGREE: Multiple dietary modification options and treatment options were discussed and  Kristi Huynh agreed to follow the recommendations documented in the above note.  ARRANGE: Kristi Huynh was educated on the importance of frequent visits to treat obesity as outlined per CMS and USPSTF guidelines and agreed to schedule her next follow up appointment today.  I, Kristi Huynh, am acting as Location manager for [employer], PA-C I, Kristi Potash, PA-C have reviewed above note and agree with its content

## 2018-12-14 ENCOUNTER — Encounter (INDEPENDENT_AMBULATORY_CARE_PROVIDER_SITE_OTHER): Payer: Self-pay

## 2018-12-14 ENCOUNTER — Ambulatory Visit (INDEPENDENT_AMBULATORY_CARE_PROVIDER_SITE_OTHER): Payer: 59 | Admitting: Physician Assistant

## 2019-01-11 ENCOUNTER — Encounter (INDEPENDENT_AMBULATORY_CARE_PROVIDER_SITE_OTHER): Payer: Self-pay

## 2019-01-11 ENCOUNTER — Other Ambulatory Visit (INDEPENDENT_AMBULATORY_CARE_PROVIDER_SITE_OTHER): Payer: Self-pay | Admitting: Physician Assistant

## 2019-01-11 DIAGNOSIS — R7303 Prediabetes: Secondary | ICD-10-CM

## 2019-04-29 ENCOUNTER — Encounter: Payer: Self-pay | Admitting: Family Medicine

## 2019-04-29 ENCOUNTER — Ambulatory Visit (INDEPENDENT_AMBULATORY_CARE_PROVIDER_SITE_OTHER): Payer: 59 | Admitting: Family Medicine

## 2019-04-29 ENCOUNTER — Other Ambulatory Visit: Payer: Self-pay

## 2019-04-29 VITALS — BP 143/88 | HR 79 | Temp 98.2°F | Resp 16 | Ht 63.5 in | Wt 207.2 lb

## 2019-04-29 DIAGNOSIS — Z6835 Body mass index (BMI) 35.0-35.9, adult: Secondary | ICD-10-CM

## 2019-04-29 DIAGNOSIS — Z0000 Encounter for general adult medical examination without abnormal findings: Secondary | ICD-10-CM

## 2019-04-29 DIAGNOSIS — E559 Vitamin D deficiency, unspecified: Secondary | ICD-10-CM

## 2019-04-29 DIAGNOSIS — Z1211 Encounter for screening for malignant neoplasm of colon: Secondary | ICD-10-CM | POA: Diagnosis not present

## 2019-04-29 DIAGNOSIS — F439 Reaction to severe stress, unspecified: Secondary | ICD-10-CM | POA: Diagnosis not present

## 2019-04-29 DIAGNOSIS — E66812 Obesity, class 2: Secondary | ICD-10-CM

## 2019-04-29 DIAGNOSIS — I10 Essential (primary) hypertension: Secondary | ICD-10-CM

## 2019-04-29 DIAGNOSIS — G4709 Other insomnia: Secondary | ICD-10-CM | POA: Diagnosis not present

## 2019-04-29 DIAGNOSIS — E669 Obesity, unspecified: Secondary | ICD-10-CM | POA: Diagnosis not present

## 2019-04-29 DIAGNOSIS — E7849 Other hyperlipidemia: Secondary | ICD-10-CM | POA: Diagnosis not present

## 2019-04-29 DIAGNOSIS — R7303 Prediabetes: Secondary | ICD-10-CM

## 2019-04-29 MED ORDER — PHENTERMINE HCL 37.5 MG PO CAPS: 37.5000 mg | ORAL_CAPSULE | ORAL | 3 refills | Status: DC

## 2019-04-29 MED FILL — PHENTERMINE 37.5 MG TABLET: 37.5 | 30 days supply | Qty: 30 | Fill #0

## 2019-04-29 NOTE — Progress Notes (Signed)
Office Visit Note   Patient: Kristi Huynh           Date of Birth: 1966-01-23           MRN: [id number] Visit Date: 04/29/2019 Requested by: No referring provider defined for this encounter. PCP: Dell Ponto, DO (Inactive)  Subjective: Chief Complaint  Patient presents with  . Annual Exam    HPI: She is here for annual wellness exam.  She has a history of prediabetes for which she watches what she eats, tries to minimize processed foods.  She has been on metformin but did not tolerate it, is now no longer taking it.  She has a history of hypertension not treated with medication.  She has hydrochlorothiazide but only uses it for peripheral edema.  She is working on losing weight but is struggling with this.  She has a history of vitamin D deficiency treated with over-the-counter supplement.  She has situational stress/anxiety and benefits from monthly massage therapy.  Family history was reviewed and updated.  She sees her gynecologist for annual mammograms and Pap smears.  She has not had colonoscopy but would prefer to test in a different way.  She is up-to-date on immunizations through work.  She is up-to-date on eye exams and dental visits.                ROS: All other systems are reviewed and are negative.    Objective: Vital Signs: BP (!) 143/88 (BP Location: Left Arm, Patient Position: Sitting, Cuff Size: Large)   Pulse 79   Temp 98.2 F (36.8 C)   Resp 16   Ht 5' 3.5" (1.613 m)   Wt 207 lb 3.2 oz (94 kg)   LMP  (LMP Unknown)   BMI 36.13 kg/m   Physical Exam:  HEENT:  [place]/AT, PERRLA, EOM Full, no nystagmus.  Funduscopic examination within normal limits.  No conjunctival erythema.  Tympanic membranes are pearly gray with normal landmarks.  External ear canals are normal.  Nasal passages are clear.  Oropharynx is clear.  No significant lymphadenopathy.  No thyromegaly or nodules.  2+ carotid pulses without bruits. CV: Regular rate and rhythm without  murmurs, rubs, or gallops.  No peripheral edema.  2+ radial and posterior tibial pulses. Lungs: Clear to auscultation throughout with no wheezing or areas of consolidation. Abd: Bowel sounds are active, no hepatosplenomegaly or masses.  Soft and nontender.  No audible bruits.  No evidence of ascites. Extremities: She has white spots on several fingernails.  No peripheral edema today.    Imaging: None today.  Assessment & Plan: 1.  Annual wellness examination -Labs today.  2.  Hypertension - Start magnesium 400 mg daily.  3.  Prediabetes -Continue to focus on healthy eating and weight loss along with exercise.  4.  Obesity -Trial of phentermine.  Watch blood pressure closely.    Follow-Up Instructions: No follow-ups on file.     Procedures: None today.   PMFS History: Patient Active Problem List   Diagnosis Date Noted  . Situational stress 04/29/2019  . Other insomnia 03/25/2017  . At risk for diabetes mellitus 03/11/2017  . Other hyperlipidemia 02/23/2017  . Vitamin D deficiency 12/31/2016  . Prediabetes 12/03/2016  . Essential hypertension 11/04/2016  . Constipation 11/04/2016  . Class 2 obesity without serious comorbidity with body mass index (BMI) of 35.0 to 35.9 in adult 11/04/2016  . ABSENCE OF MENSTRUATION 12/03/2009  . WEIGHT GAIN 12/03/2009  . FATIGUE 10/24/2008  Past Medical History:  Diagnosis Date  . Anxiety   . back pain   . Depression   . joint pain   . Swelling    feet and legs    Family History  Problem Relation Age of Onset  . Diabetes Mother   . Hypertension Mother   . Hyperlipidemia Mother   . Thyroid disease Mother   . Depression Mother   . Anxiety disorder Mother   . Eating disorder Mother   . Obesity Mother   . Hypercholesterolemia Mother   . Heart disease Father   . Sudden death Father 44       Aneurysm  . Obesity Brother     Past Surgical History:  Procedure Laterality Date  . ANKLE SURGERY     Right 2 times, once as  teen and once at age 66  . CYST REMOVAL HAND     Right hand age 10 and 75  . LAPAROSCOPY     endometrosis  . TONSILLECTOMY AND ADENOIDECTOMY     53 years old   Social History   Occupational History  . Occupation: Clinical Patient Account Specialist  Tobacco Use  . Smoking status: Never Smoker  . Smokeless tobacco: Never Used  Substance and Sexual Activity  . Alcohol use: Not on file  . Drug use: Not on file  . Sexual activity: Not on file

## 2019-04-30 ENCOUNTER — Telehealth: Payer: Self-pay | Admitting: Family Medicine

## 2019-04-30 LAB — LIPID PANEL
Cholesterol: 216 mg/dL — ABNORMAL HIGH (ref <200)
HDL: 40 mg/dL — ABNORMAL LOW (ref >=50)
LDL Cholesterol (Calc): 149 mg/dL (calc) — ABNORMAL HIGH
Non-HDL Cholesterol (Calc): 176 mg/dL (calc) — ABNORMAL HIGH (ref <130)
Total CHOL/HDL Ratio: 5.4 (calc) — ABNORMAL HIGH (ref <5.0)
Triglycerides: 141 mg/dL (ref <150)

## 2019-04-30 LAB — CBC WITH DIFFERENTIAL/PLATELET
Absolute Monocytes: 439 cells/uL (ref 200–950)
Basophils Absolute: 49 cells/uL (ref 0–200)
Basophils Relative: 0.8 %
Eosinophils Absolute: 79 cells/uL (ref 15–500)
Eosinophils Relative: 1.3 %
HCT: 42 % (ref 35.0–45.0)
Hemoglobin: 14.4 g/dL (ref 11.7–15.5)
Lymphs Abs: 2037 cells/uL (ref 850–3900)
MCH: 30.4 pg (ref 27.0–33.0)
MCHC: 34.3 g/dL (ref 32.0–36.0)
MCV: 88.8 fL (ref 80.0–100.0)
MPV: 10.3 fL (ref 7.5–12.5)
Monocytes Relative: 7.2 %
Neutro Abs: 3495 cells/uL (ref 1500–7800)
Neutrophils Relative %: 57.3 %
Platelets: 289 10*3/uL (ref 140–400)
RBC: 4.73 10*6/uL (ref 3.80–5.10)
RDW: 13 % (ref 11.0–15.0)
Total Lymphocyte: 33.4 %
WBC: 6.1 10*3/uL (ref 3.8–10.8)

## 2019-04-30 LAB — THYROID PANEL WITH TSH
Free Thyroxine Index: 2.6 (ref 1.4–3.8)
T3 Uptake: 29 % (ref 22–35)
T4, Total: 8.8 ug/dL (ref 5.1–11.9)
TSH: 1.46 mIU/L

## 2019-04-30 LAB — COMPREHENSIVE METABOLIC PANEL
AG Ratio: 1.6 (calc) (ref 1.0–2.5)
ALT: 9 U/L (ref 6–29)
AST: 19 U/L (ref 10–35)
Albumin: 4.6 g/dL (ref 3.6–5.1)
Alkaline phosphatase (APISO): 52 U/L (ref 37–153)
BUN: 10 mg/dL (ref 7–25)
CO2: 29 mmol/L (ref 20–32)
Calcium: 9.7 mg/dL (ref 8.6–10.4)
Chloride: 103 mmol/L (ref 98–110)
Creat: 0.74 mg/dL (ref 0.50–1.05)
Globulin: 2.9 g/dL (calc) (ref 1.9–3.7)
Glucose, Bld: 91 mg/dL (ref 65–99)
Potassium: 4.1 mmol/L (ref 3.5–5.3)
Sodium: 140 mmol/L (ref 135–146)
Total Bilirubin: 0.6 mg/dL (ref 0.2–1.2)
Total Protein: 7.5 g/dL (ref 6.1–8.1)

## 2019-04-30 LAB — HEMOGLOBIN A1C
Hgb A1c MFr Bld: 5.5 % of total Hgb (ref <5.7)
Mean Plasma Glucose: 111 (calc)
eAG (mmol/L): 6.2 (calc)

## 2019-04-30 LAB — HIGH SENSITIVITY CRP: hs-CRP: 2.4 mg/L

## 2019-04-30 LAB — VITAMIN D 25 HYDROXY (VIT D DEFICIENCY, FRACTURES): Vit D, 25-Hydroxy: 50 ng/mL (ref 30–100)

## 2019-04-30 NOTE — Telephone Encounter (Signed)
Lipids a little high, but best to manage with lifestyle.  Minimizing intake of breads; pastas; cereals; sugars/sweets often does the trick.  Regular exercise is also beneficial.  All others look good.  Recheck lipids in 6-12 months.

## 2019-05-30 MED FILL — PHENTERMINE 37.5 MG TABLET: 37.5 | 30 days supply | Qty: 30 | Fill #1

## 2019-06-03 DIAGNOSIS — Z124 Encounter for screening for malignant neoplasm of cervix: Secondary | ICD-10-CM | POA: Diagnosis not present

## 2019-06-03 DIAGNOSIS — Z01419 Encounter for gynecological examination (general) (routine) without abnormal findings: Secondary | ICD-10-CM | POA: Diagnosis not present

## 2019-06-03 DIAGNOSIS — Z1231 Encounter for screening mammogram for malignant neoplasm of breast: Secondary | ICD-10-CM | POA: Diagnosis not present

## 2019-07-04 MED FILL — PHENTERMINE 37.5 MG TABLET: 37.5 | 30 days supply | Qty: 30 | Fill #2

## 2019-08-16 MED FILL — PHENTERMINE 37.5 MG TABLET: 37.5 | 30 days supply | Qty: 30 | Fill #3

## 2020-07-11 DIAGNOSIS — Z01419 Encounter for gynecological examination (general) (routine) without abnormal findings: Secondary | ICD-10-CM | POA: Diagnosis not present

## 2020-07-11 DIAGNOSIS — Z13220 Encounter for screening for lipoid disorders: Secondary | ICD-10-CM | POA: Diagnosis not present

## 2020-07-11 DIAGNOSIS — Z1329 Encounter for screening for other suspected endocrine disorder: Secondary | ICD-10-CM | POA: Diagnosis not present

## 2020-07-11 DIAGNOSIS — Z130 Encounter for screening for diseases of the blood and blood-forming organs and certain disorders involving the immune mechanism: Secondary | ICD-10-CM | POA: Diagnosis not present

## 2020-07-11 DIAGNOSIS — Z1231 Encounter for screening mammogram for malignant neoplasm of breast: Secondary | ICD-10-CM | POA: Diagnosis not present

## 2020-11-26 DIAGNOSIS — L570 Actinic keratosis: Secondary | ICD-10-CM | POA: Diagnosis not present

## 2020-11-26 DIAGNOSIS — L814 Other melanin hyperpigmentation: Secondary | ICD-10-CM | POA: Diagnosis not present

## 2020-11-26 DIAGNOSIS — B078 Other viral warts: Secondary | ICD-10-CM | POA: Diagnosis not present

## 2021-02-22 ENCOUNTER — Ambulatory Visit: Payer: 59 | Admitting: Family Medicine

## 2021-03-29 ENCOUNTER — Ambulatory Visit: Payer: 59 | Admitting: Family Medicine

## 2021-05-06 ENCOUNTER — Encounter: Payer: Self-pay | Admitting: Family Medicine

## 2021-05-06 ENCOUNTER — Other Ambulatory Visit: Payer: Self-pay

## 2021-05-06 ENCOUNTER — Ambulatory Visit (INDEPENDENT_AMBULATORY_CARE_PROVIDER_SITE_OTHER): Payer: 59 | Admitting: Family Medicine

## 2021-05-06 VITALS — BP 148/75 | HR 75 | Ht 63.75 in | Wt 211.0 lb

## 2021-05-06 DIAGNOSIS — I10 Essential (primary) hypertension: Secondary | ICD-10-CM

## 2021-05-06 DIAGNOSIS — R7303 Prediabetes: Secondary | ICD-10-CM | POA: Diagnosis not present

## 2021-05-06 DIAGNOSIS — Z6835 Body mass index (BMI) 35.0-35.9, adult: Secondary | ICD-10-CM | POA: Diagnosis not present

## 2021-05-06 DIAGNOSIS — Z0000 Encounter for general adult medical examination without abnormal findings: Secondary | ICD-10-CM | POA: Diagnosis not present

## 2021-05-06 DIAGNOSIS — L608 Other nail disorders: Secondary | ICD-10-CM

## 2021-05-06 DIAGNOSIS — E669 Obesity, unspecified: Secondary | ICD-10-CM | POA: Diagnosis not present

## 2021-05-06 DIAGNOSIS — F419 Anxiety disorder, unspecified: Secondary | ICD-10-CM

## 2021-05-06 DIAGNOSIS — E7849 Other hyperlipidemia: Secondary | ICD-10-CM

## 2021-05-06 DIAGNOSIS — E559 Vitamin D deficiency, unspecified: Secondary | ICD-10-CM | POA: Diagnosis not present

## 2021-05-06 NOTE — Progress Notes (Signed)
Office Visit Note   Patient: Kristi Huynh           Date of Birth: 12/06/1965           MRN: [id number] Visit Date: 05/06/2021 Requested by: No referring provider defined for this encounter. PCP: Dell Ponto, DO (Inactive)  Subjective: Chief Complaint  Patient presents with   Annual Exam    HPI: She is here for a wellness examination.  She has been under a lot of stress lately.  She is the primary caretaker for her husband as well as her mother, and she is even responsible for her in-laws who live out of state.  She is not on any prescriptions.  Blood pressure has been intermittently elevated when checked.  She is managing prediabetes with lifestyle.  She still struggles with obesity and is planning to try going vegetarian again soon.  She has several skin lesions that have popped up since her dermatology visit this past spring.  She is up-to-date on gynecologic visits.  She has not yet done colon cancer screening.  Family history was updated.                ROS:   All other systems were reviewed and are negative.  Objective: Vital Signs: BP (!) 148/75 (BP Location: Left Arm, Patient Position: Sitting, Cuff Size: Large)   Pulse 75   Ht 5' 3.75" (1.619 m)   Wt 211 lb (95.7 kg)   LMP  (LMP Unknown)   BMI 36.50 kg/m   Physical Exam:  General:  Alert and oriented, in no acute distress. Pulm:  Breathing unlabored. Psy:  Normal mood, congruent affect. Skin: There is a lesion on her upper mid chest which is raised and has a rough surface.  On her right dorsal forearm there is another raised lesion with a rough surface.  On her feet, she has what appear to be subcutaneous lipomas on the medial and lateral side of her calcaneus. HEENT:  [place]/AT, PERRLA, EOM Full, no nystagmus.  Funduscopic examination within normal limits.  No conjunctival erythema.  Tympanic membranes are pearly gray with normal landmarks.  External ear canals are normal.  Nasal passages are clear.   Oropharynx is clear.  No significant lymphadenopathy.  No thyromegaly or nodules.  2+ carotid pulses without bruits. CV: Regular rate and rhythm without murmurs, rubs, or gallops.  No peripheral edema.  2+ radial and posterior tibial pulses. Lungs: Clear to auscultation throughout with no wheezing or areas of consolidation. Abd: Bowel sounds are active, no hepatosplenomegaly or masses.  Soft and nontender.  No audible bruits.  No evidence of ascites. Extremities: She has leukonychia on a couple fingernails.    Imaging: No results found.  Assessment & Plan: Wellness examination -Labs drawn.  Cologuard ordered.  2.  Anxiety - L-Theanine.  K[place] Target GBX probiotics. -Check copper and zinc levels.  She has not tolerated the zinc supplements in the past due to nausea.  3.  Blood pressure elevation -Continue to attempt lifestyle modifications.  4.  Prediabetes - Recheck labs today.  5.  History of vitamin D deficiency - Recheck levels today.     Procedures: No procedures performed        PMFS History: Patient Active Problem List   Diagnosis Date Noted   Situational stress 04/29/2019   Other insomnia 03/25/2017   At risk for diabetes mellitus 03/11/2017   Other hyperlipidemia 02/23/2017   Vitamin D deficiency 12/31/2016   Prediabetes 12/03/2016  Essential hypertension 11/04/2016   Constipation 11/04/2016   Class 2 obesity without serious comorbidity with body mass index (BMI) of 35.0 to 35.9 in adult 11/04/2016   ABSENCE OF MENSTRUATION 12/03/2009   WEIGHT GAIN 12/03/2009   FATIGUE 10/24/2008   Past Medical History:  Diagnosis Date   Anxiety    back pain    Depression    joint pain    Swelling    feet and legs    Family History  Problem Relation Age of Onset   Diabetes Mother    Hypertension Mother    Hyperlipidemia Mother    Thyroid disease Mother    Depression Mother    Anxiety disorder Mother    Eating disorder Mother    Obesity Mother     Hypercholesterolemia Mother    Heart disease Father    Sudden death Father 32       Aneurysm   Obesity Brother    Cancer Maternal Aunt    Breast cancer Maternal Aunt    Breast cancer Paternal Aunt    Cancer Paternal Aunt    Cancer Maternal Grandfather    Cancer - Colon Maternal Grandfather     Past Surgical History:  Procedure Laterality Date   ANKLE SURGERY     Right 2 times, once as teen and once at age 62   CYST REMOVAL HAND     Right hand age 71 and 35   LAPAROSCOPY     endometrosis   TONSILLECTOMY AND ADENOIDECTOMY     55 years old   Social History   Occupational History   Occupation: Clinical Patient Account Specialist  Tobacco Use   Smoking status: Never   Smokeless tobacco: Never  Substance and Sexual Activity   Alcohol use: Not on file   Drug use: Not on file   Sexual activity: Not on file

## 2021-05-09 ENCOUNTER — Telehealth: Payer: Self-pay | Admitting: Family Medicine

## 2021-05-09 LAB — COPPER, SERUM: Copper: 97 ug/dL (ref 70–175)

## 2021-05-09 LAB — HEMOGLOBIN A1C
Hgb A1c MFr Bld: 5.6 % of total Hgb (ref <5.7)
Mean Plasma Glucose: 114 mg/dL
eAG (mmol/L): 6.3 mmol/L

## 2021-05-09 LAB — COMPREHENSIVE METABOLIC PANEL
AG Ratio: 1.6 (calc) (ref 1.0–2.5)
ALT: 13 U/L (ref 6–29)
AST: 19 U/L (ref 10–35)
Albumin: 4.6 g/dL (ref 3.6–5.1)
Alkaline phosphatase (APISO): 66 U/L (ref 37–153)
BUN: 11 mg/dL (ref 7–25)
CO2: 28 mmol/L (ref 20–32)
Calcium: 9.5 mg/dL (ref 8.6–10.4)
Chloride: 101 mmol/L (ref 98–110)
Creat: 0.81 mg/dL (ref 0.50–1.03)
Globulin: 2.8 g/dL (calc) (ref 1.9–3.7)
Glucose, Bld: 88 mg/dL (ref 65–99)
Potassium: 4 mmol/L (ref 3.5–5.3)
Sodium: 140 mmol/L (ref 135–146)
Total Bilirubin: 0.8 mg/dL (ref 0.2–1.2)
Total Protein: 7.4 g/dL (ref 6.1–8.1)

## 2021-05-09 LAB — THYROID PANEL WITH TSH
Free Thyroxine Index: 2.1 (ref 1.4–3.8)
T3 Uptake: 27 % (ref 22–35)
T4, Total: 7.9 ug/dL (ref 5.1–11.9)
TSH: 2.14 mIU/L (ref 0.40–4.50)

## 2021-05-09 LAB — CBC WITH DIFFERENTIAL/PLATELET
Absolute Monocytes: 469 cells/uL (ref 200–950)
Basophils Absolute: 60 cells/uL (ref 0–200)
Basophils Relative: 0.9 %
Eosinophils Absolute: 161 cells/uL (ref 15–500)
Eosinophils Relative: 2.4 %
HCT: 43.4 % (ref 35.0–45.0)
Hemoglobin: 14.2 g/dL (ref 11.7–15.5)
Lymphs Abs: 2097 cells/uL (ref 850–3900)
MCH: 29.5 pg (ref 27.0–33.0)
MCHC: 32.7 g/dL (ref 32.0–36.0)
MCV: 90 fL (ref 80.0–100.0)
MPV: 9.7 fL (ref 7.5–12.5)
Monocytes Relative: 7 %
Neutro Abs: 3913 cells/uL (ref 1500–7800)
Neutrophils Relative %: 58.4 %
Platelets: 274 10*3/uL (ref 140–400)
RBC: 4.82 10*6/uL (ref 3.80–5.10)
RDW: 13 % (ref 11.0–15.0)
Total Lymphocyte: 31.3 %
WBC: 6.7 10*3/uL (ref 3.8–10.8)

## 2021-05-09 LAB — LIPID PANEL
Cholesterol: 213 mg/dL — ABNORMAL HIGH (ref <200)
HDL: 37 mg/dL — ABNORMAL LOW (ref >=50)
LDL Cholesterol (Calc): 136 mg/dL (calc) — ABNORMAL HIGH
Non-HDL Cholesterol (Calc): 176 mg/dL (calc) — ABNORMAL HIGH (ref <130)
Total CHOL/HDL Ratio: 5.8 (calc) — ABNORMAL HIGH (ref <5.0)
Triglycerides: 257 mg/dL — ABNORMAL HIGH (ref <150)

## 2021-05-09 LAB — VITAMIN D 25 HYDROXY (VIT D DEFICIENCY, FRACTURES): Vit D, 25-Hydroxy: 41 ng/mL (ref 30–100)

## 2021-05-09 LAB — ZINC: Zinc: 79 ug/dL (ref 60–130)

## 2021-05-09 LAB — HIGH SENSITIVITY CRP: hs-CRP: 3.3 mg/L — ABNORMAL HIGH

## 2021-05-09 NOTE — Telephone Encounter (Signed)
Labs are notable for the following:  Copper level is elevated relative to the zinc level.  The ratio is 1.22.  We want the ratio to be 1.0 or slightly lower.  I recommend taking zinc at 20-30 mg daily and having these rechecked in about 6 months.  Imbalance him copper and zinc can be associated with anxiety, hormone imbalance, etc.  Lipids are abnormal.  It is important to exercise regularly and to minimize dietary intake of breads, pastas, cereals, sugars and sweets.  Recheck this in about 6 months.  C-reactive protein, inflammation marker, is elevated at 3.3.  Inflammation is associated with increased cardiac risk.  This should also improve with lifestyle changes mentioned above.  Recheck in 6 months.  All else looks good.

## 2021-06-14 DIAGNOSIS — Z1212 Encounter for screening for malignant neoplasm of rectum: Secondary | ICD-10-CM | POA: Diagnosis not present

## 2021-06-14 DIAGNOSIS — Z1211 Encounter for screening for malignant neoplasm of colon: Secondary | ICD-10-CM | POA: Diagnosis not present

## 2021-06-20 LAB — COLOGUARD: COLOGUARD: NEGATIVE

## 2021-07-08 ENCOUNTER — Other Ambulatory Visit: Payer: Self-pay

## 2021-08-06 DIAGNOSIS — Z1231 Encounter for screening mammogram for malignant neoplasm of breast: Secondary | ICD-10-CM | POA: Diagnosis not present

## 2021-08-06 DIAGNOSIS — Z01419 Encounter for gynecological examination (general) (routine) without abnormal findings: Secondary | ICD-10-CM | POA: Diagnosis not present

## 2021-11-28 DIAGNOSIS — D225 Melanocytic nevi of trunk: Secondary | ICD-10-CM | POA: Diagnosis not present

## 2021-11-28 DIAGNOSIS — L814 Other melanin hyperpigmentation: Secondary | ICD-10-CM | POA: Diagnosis not present

## 2021-11-28 DIAGNOSIS — L821 Other seborrheic keratosis: Secondary | ICD-10-CM | POA: Diagnosis not present

## 2021-11-28 DIAGNOSIS — D1801 Hemangioma of skin and subcutaneous tissue: Secondary | ICD-10-CM | POA: Diagnosis not present

## 2022-04-11 ENCOUNTER — Ambulatory Visit (INDEPENDENT_AMBULATORY_CARE_PROVIDER_SITE_OTHER): Payer: 59

## 2022-04-11 ENCOUNTER — Ambulatory Visit (INDEPENDENT_AMBULATORY_CARE_PROVIDER_SITE_OTHER): Payer: 59 | Admitting: Surgical

## 2022-04-11 DIAGNOSIS — M25571 Pain in right ankle and joints of right foot: Secondary | ICD-10-CM | POA: Diagnosis not present

## 2022-04-11 DIAGNOSIS — S93411A Sprain of calcaneofibular ligament of right ankle, initial encounter: Secondary | ICD-10-CM | POA: Diagnosis not present

## 2022-04-12 ENCOUNTER — Encounter: Payer: Self-pay | Admitting: Orthopedic Surgery

## 2022-04-12 NOTE — Progress Notes (Signed)
Office Visit Note   Patient: Kristi Huynh           Date of Birth: Jan 03, 1966           MRN: [id number] Visit Date: 04/11/2022 Requested by: Dell Ponto, DO [address] [place],  [place] [address] PCP: Dell Ponto, DO  Subjective: Chief Complaint  Patient presents with   Right Ankle - Pain    Right ankle injury while mowing on 04/10/22    HPI: Kristi Huynh is a 56 y.o. female who presents to the office complaining of right ankle injury.  Patient states that she sustained injury yesterday while push mowing her lawn and stepping in a hole.  She notes onset of immediate pain and swelling.  Has not noticed any bruising.  No mechanical symptoms or ankle instability.  She has been wrapping it with an Ace bandage.  Taking over-the-counter medications with some relief of her pain.  Walking with slight antalgia.  No history of prior significant injury to this right ankle but she does have history of left ankle fracture that was treated operatively.  She is able to bear weight.  Pain did not wake her up at night last night..                ROS: All systems reviewed are negative as they relate to the chief complaint within the history of present illness.  Patient denies fevers or chills.  Assessment & Plan: Visit Diagnoses:  1. Sprain of calcaneofibular ligament of right ankle, initial encounter   2. Pain in right ankle and joints of right foot     Plan: Patient is a 56 year old female who presents for evaluation of right ankle injury.  She sustained ankle injury yesterday while push mowing her lawn.  A lot of lateral sided pain and swelling with majority of her tenderness over the CFL.  Radiographs are negative for any identifiable fracture or dislocation.  After discussion, plan for use of short walker boot for 7 to 10 days and then transition to ankle brace with therapy exercises at that time.  Recommend she reach out to [name] in the meantime if she notices any worsening of her pain  or start having pain at night or difficulty ambulating.  Patient agreed with plan.  Follow-up in 10 days.  Follow-Up Instructions: No follow-ups on file.   Orders:  Orders Placed This Encounter  Procedures   XR Ankle Complete Right   No orders of the defined types were placed in this encounter.     Procedures: No procedures performed   Clinical Data: No additional findings.  Objective: Vital Signs: LMP  (LMP Unknown)   Physical Exam:  Constitutional: Patient appears well-developed HEENT:  Head: Normocephalic Eyes:EOM are normal Neck: Normal range of motion Cardiovascular: Normal rate Pulmonary/chest: Effort normal Neurologic: Patient is alert Skin: Skin is warm Psychiatric: Patient has normal mood and affect  Ortho Exam: Ortho exam demonstrates right ankle with intact dorsiflexion, plantarflexion, inversion, eversion.  Tenderness primarily over the CFL moderately and ATFL mildly.  Mild deltoid ligament tenderness.  No tenderness over the lateral malleolus, medial malleolus, Lisfranc complex, fifth metatarsal base, Achilles tendon, Achilles tendon insertion.  Achilles tendon is palpable along with the anterior tibialis tendon which is palpable and both are intact by palpation.  No ecchymosis is noted.  There is moderate swelling noted over the lateral aspect of the ankle.  Stable to syndesmotic stressing.  Negative syndesmotic squeeze test.  No  proximal fibular tenderness.  Stable to anterior drawer.  Specialty Comments:  No specialty comments available.  Imaging: No results found.   PMFS History: Patient Active Problem List   Diagnosis Date Noted   Situational stress 04/29/2019   Other insomnia 03/25/2017   At risk for diabetes mellitus 03/11/2017   Other hyperlipidemia 02/23/2017   Vitamin D deficiency 12/31/2016   Prediabetes 12/03/2016   Essential hypertension 11/04/2016   Constipation 11/04/2016   Class 2 obesity without serious comorbidity with body mass  index (BMI) of 35.0 to 35.9 in adult 11/04/2016   ABSENCE OF MENSTRUATION 12/03/2009   WEIGHT GAIN 12/03/2009   FATIGUE 10/24/2008   Past Medical History:  Diagnosis Date   Anxiety    back pain    Depression    joint pain    Swelling    feet and legs    Family History  Problem Relation Age of Onset   Diabetes Mother    Hypertension Mother    Hyperlipidemia Mother    Thyroid disease Mother    Depression Mother    Anxiety disorder Mother    Eating disorder Mother    Obesity Mother    Hypercholesterolemia Mother    Heart disease Father    Sudden death Father 59       Aneurysm   Obesity Brother    Cancer Maternal Aunt    Breast cancer Maternal Aunt    Breast cancer Paternal Aunt    Cancer Paternal Aunt    Cancer Maternal Grandfather    Cancer - Colon Maternal Grandfather     Past Surgical History:  Procedure Laterality Date   ANKLE SURGERY     Right 2 times, once as teen and once at age 43   CYST REMOVAL HAND     Right hand age 82 and 73   LAPAROSCOPY     endometrosis   TONSILLECTOMY AND ADENOIDECTOMY     56 years old   Social History   Occupational History   Occupation: Clinical Patient Account Specialist  Tobacco Use   Smoking status: Never   Smokeless tobacco: Never  Substance and Sexual Activity   Alcohol use: Not on file   Drug use: Not on file   Sexual activity: Not on file

## 2022-05-12 ENCOUNTER — Other Ambulatory Visit (HOSPITAL_COMMUNITY): Payer: Self-pay

## 2022-05-12 MED ORDER — TRIAMCINOLONE ACETONIDE 0.5 % EX CREA
1.0000 | TOPICAL_CREAM | Freq: Two times a day (BID) | CUTANEOUS | 1 refills | Status: AC | PRN
  Filled 2022-05-12: qty 15, 8d supply, fill #0

## 2022-05-12 MED ORDER — METHYLPREDNISOLONE 4 MG PO TBPK
ORAL_TABLET | ORAL | 0 refills | Status: DC
  Filled 2022-05-12: qty 21, 6d supply, fill #0

## 2022-08-08 ENCOUNTER — Other Ambulatory Visit (HOSPITAL_COMMUNITY): Payer: Self-pay

## 2022-08-08 MED ORDER — AZITHROMYCIN 250 MG PO TABS
ORAL_TABLET | ORAL | 0 refills | Status: AC
  Filled 2022-08-08: qty 6, 5d supply, fill #0

## 2022-08-08 MED ORDER — BENZONATATE 100 MG PO CAPS
100.0000 mg | ORAL_CAPSULE | Freq: Three times a day (TID) | ORAL | 3 refills | Status: AC | PRN
  Filled 2022-08-08: qty 60, 10d supply, fill #0

## 2022-08-12 DIAGNOSIS — Z124 Encounter for screening for malignant neoplasm of cervix: Secondary | ICD-10-CM | POA: Diagnosis not present

## 2022-08-12 DIAGNOSIS — Z1231 Encounter for screening mammogram for malignant neoplasm of breast: Secondary | ICD-10-CM | POA: Diagnosis not present

## 2022-08-12 DIAGNOSIS — Z01419 Encounter for gynecological examination (general) (routine) without abnormal findings: Secondary | ICD-10-CM | POA: Diagnosis not present

## 2022-08-20 DIAGNOSIS — H40013 Open angle with borderline findings, low risk, bilateral: Secondary | ICD-10-CM | POA: Diagnosis not present

## 2022-09-17 ENCOUNTER — Other Ambulatory Visit: Payer: Self-pay | Admitting: Family Medicine

## 2022-09-17 ENCOUNTER — Ambulatory Visit (INDEPENDENT_AMBULATORY_CARE_PROVIDER_SITE_OTHER): Payer: 59

## 2022-09-17 DIAGNOSIS — R059 Cough, unspecified: Secondary | ICD-10-CM | POA: Diagnosis not present

## 2022-09-30 ENCOUNTER — Other Ambulatory Visit (HOSPITAL_COMMUNITY): Payer: Self-pay

## 2022-09-30 MED ORDER — BENZONATATE 100 MG PO CAPS
100.0000 mg | ORAL_CAPSULE | Freq: Three times a day (TID) | ORAL | 3 refills | Status: AC | PRN
  Filled 2022-09-30: qty 60, 10d supply, fill #0

## 2022-11-28 DIAGNOSIS — L821 Other seborrheic keratosis: Secondary | ICD-10-CM | POA: Diagnosis not present

## 2022-11-28 DIAGNOSIS — L720 Epidermal cyst: Secondary | ICD-10-CM | POA: Diagnosis not present

## 2022-11-28 DIAGNOSIS — L7211 Pilar cyst: Secondary | ICD-10-CM | POA: Diagnosis not present

## 2022-11-28 DIAGNOSIS — D2261 Melanocytic nevi of right upper limb, including shoulder: Secondary | ICD-10-CM | POA: Diagnosis not present

## 2022-11-28 DIAGNOSIS — D1801 Hemangioma of skin and subcutaneous tissue: Secondary | ICD-10-CM | POA: Diagnosis not present

## 2022-11-28 DIAGNOSIS — D225 Melanocytic nevi of trunk: Secondary | ICD-10-CM | POA: Diagnosis not present

## 2022-11-28 DIAGNOSIS — L814 Other melanin hyperpigmentation: Secondary | ICD-10-CM | POA: Diagnosis not present

## 2022-11-28 DIAGNOSIS — D2272 Melanocytic nevi of left lower limb, including hip: Secondary | ICD-10-CM | POA: Diagnosis not present

## 2023-01-02 ENCOUNTER — Other Ambulatory Visit (INDEPENDENT_AMBULATORY_CARE_PROVIDER_SITE_OTHER): Payer: Commercial Managed Care - PPO

## 2023-01-02 ENCOUNTER — Ambulatory Visit (INDEPENDENT_AMBULATORY_CARE_PROVIDER_SITE_OTHER): Payer: Commercial Managed Care - PPO | Admitting: Surgical

## 2023-01-02 ENCOUNTER — Encounter: Payer: Self-pay | Admitting: Surgical

## 2023-01-02 DIAGNOSIS — M25561 Pain in right knee: Secondary | ICD-10-CM

## 2023-01-02 NOTE — Progress Notes (Signed)
Office Visit Note   Patient: Kristi Huynh           Date of Birth: 1966/03/03           MRN: [id number] Visit Date: 01/02/2023 Requested by: Dell Ponto, DO [address] [place],  [place] [address] PCP: Dell Ponto, DO  Subjective: Chief Complaint  Patient presents with   Right Knee - Pain    HPI: Kristi Huynh is a 57 y.o. female who presents to the office reporting right knee pain.  Patient states that she injured her knee about 6 weeks ago when she fell while doing dishes.  She stumbled sideways striking her right side of her body into a cabinet.  Ever since then she initially noticed soreness for the next 2 to 3 weeks in the posterior aspect of the right knee.  This pain has progressively worsened over the last 3 weeks.  She has posterior medial pain primarily that is worse with stairs.  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  She has had to take the elevator over the last week instead of doing stairs which is atypical for her.  She also has pain that is waking her up pretty much every night every 2 hours.  She feels the leg is swollen with a fullness in the posterior medial aspect of the knee.  She denies any fevers or chills.  She has no calf pain.  No groin pain.  She has really never had any trouble with this knee before.  Up until recently this was her "good knee".  She has no history of prior surgery to this knee.  Denies any mechanical symptoms such as popping, clicking.                ROS: All systems reviewed are negative as they relate to the chief complaint within the history of present illness.  Patient denies fevers or chills.  Assessment & Plan: Visit Diagnoses:  1. Acute pain of right knee     Plan:.  Patient is a 57 year old female who presents complaining of 6 weeks of worsening knee pain following an injury where she stumbled sideways at her sink into a cabinet.  She has had worsening pain over the last several weeks  that has culminated in nighttime pain and an alteration to her typical activity level, switching from stairs to elevator at work.  She has pain primarily localized to the anterior medial and posterior medial joint line and the symptoms are worsened with terminal knee flexion.  She has radiographs taken today demonstrating no significant degenerative changes or any acute osseous abnormality.  She has tried anti-inflammatories and activity modification without relief.  With continued symptoms following an injury and failure of conservative management, plan to order MRI of the right knee for further evaluation of medial meniscus tear.  Want to make sure she does not have meniscal root tear prior to administration of cortisone injection since she does have primarily posterior medial pain and pain that is worse with knee flexion.  Follow-up after MRI to review results.  Follow-Up Instructions: No follow-ups on file.   Orders:  Orders Placed This Encounter  Procedures   XR KNEE 3 VIEW RIGHT   No orders of the defined types were placed in this encounter.     Procedures: No procedures performed   Clinical Data: No additional findings.  Objective: Vital Signs: LMP  (LMP Unknown)  Physical Exam:  Constitutional: Patient appears well-developed HEENT:  Head: Normocephalic Eyes:EOM are normal Neck: Normal range of motion Cardiovascular: Normal rate Pulmonary/chest: Effort normal Neurologic: Patient is alert Skin: Skin is warm Psychiatric: Patient has normal mood and affect  Ortho Exam: Ortho exam demonstrates right knee with trace effusion.  She has mild to moderate tenderness over the lateral joint line and moderate tenderness over the medial joint line.  She has pain that is worse with knee flexion terminally though she is able to flex to about 120 degrees.  She hyperextends about -3 degrees.  No calf tenderness.  Negative Homans' sign.  She has very minimal increased lateral mobility of the  patella compared to contralateral side with some mild tenderness over the medial aspect of the patella.  She has stability to anterior and posterior directed forces with negative anterior and posterior drawer signs.  Negative Lachman exam.  Stable to varus and valgus stress at 0 and 30 degrees.  She has no pain with hip range of motion.  Able to perform straight leg raise without extensor lag and excellent quad strength rated 5/5.  No cellulitis or skin changes noted.  He does have some tenderness over the posterior medial joint line and posterior aspect of the knee in general that is rated moderate.  Ambulates with moderate limp.  Specialty Comments:  No specialty comments available.  Imaging: No results found.   PMFS History: Patient Active Problem List   Diagnosis Date Noted   Situational stress 04/29/2019   Other insomnia 03/25/2017   At risk for diabetes mellitus 03/11/2017   Other hyperlipidemia 02/23/2017   Vitamin D deficiency 12/31/2016   Prediabetes 12/03/2016   Essential hypertension 11/04/2016   Constipation 11/04/2016   Class 2 obesity without serious comorbidity with body mass index (BMI) of 35.0 to 35.9 in adult 11/04/2016   ABSENCE OF MENSTRUATION 12/03/2009   WEIGHT GAIN 12/03/2009   FATIGUE 10/24/2008   Past Medical History:  Diagnosis Date   Anxiety    back pain    Depression    joint pain    Swelling    feet and legs    Family History  Problem Relation Age of Onset   Diabetes Mother    Hypertension Mother    Hyperlipidemia Mother    Thyroid disease Mother    Depression Mother    Anxiety disorder Mother    Eating disorder Mother    Obesity Mother    Hypercholesterolemia Mother    Heart disease Father    Sudden death Father 75       Aneurysm   Obesity Brother    Cancer Maternal Aunt    Breast cancer Maternal Aunt    Breast cancer Paternal Aunt    Cancer Paternal Aunt    Cancer Maternal Grandfather    Cancer - Colon Maternal Grandfather      Past Surgical History:  Procedure Laterality Date   ANKLE SURGERY     Right 2 times, once as teen and once at age 28   CYST REMOVAL HAND     Right hand age 61 and 39   LAPAROSCOPY     endometrosis   TONSILLECTOMY AND ADENOIDECTOMY     57 years old   Social History   Occupational History   Occupation: Clinical Patient Account Specialist  Tobacco Use   Smoking status: Never   Smokeless tobacco: Never  Substance and Sexual Activity   Alcohol use: Not on file   Drug use: Not on  file   Sexual activity: Not on file

## 2023-01-06 ENCOUNTER — Ambulatory Visit (INDEPENDENT_AMBULATORY_CARE_PROVIDER_SITE_OTHER): Payer: Commercial Managed Care - PPO

## 2023-01-06 DIAGNOSIS — M25561 Pain in right knee: Secondary | ICD-10-CM

## 2023-01-13 ENCOUNTER — Ambulatory Visit (INDEPENDENT_AMBULATORY_CARE_PROVIDER_SITE_OTHER): Payer: Commercial Managed Care - PPO | Admitting: Orthopedic Surgery

## 2023-01-13 ENCOUNTER — Other Ambulatory Visit (HOSPITAL_COMMUNITY): Payer: Self-pay

## 2023-01-13 ENCOUNTER — Encounter: Payer: Self-pay | Admitting: Orthopedic Surgery

## 2023-01-13 DIAGNOSIS — M25561 Pain in right knee: Secondary | ICD-10-CM

## 2023-01-13 DIAGNOSIS — M659 Synovitis and tenosynovitis, unspecified: Secondary | ICD-10-CM

## 2023-01-13 MED ORDER — BUPIVACAINE HCL 0.25 % IJ SOLN
4.0000 mL | INTRAMUSCULAR | Status: AC | PRN
Start: 2023-01-13 — End: 2023-01-13
  Administered 2023-01-13: 4 mL via INTRA_ARTICULAR

## 2023-01-13 MED ORDER — LIDOCAINE HCL 1 % IJ SOLN
5.0000 mL | INTRAMUSCULAR | Status: AC | PRN
Start: 2023-01-13 — End: 2023-01-13
  Administered 2023-01-13: 5 mL

## 2023-01-13 MED ORDER — DICLOFENAC SODIUM 75 MG PO TBEC
DELAYED_RELEASE_TABLET | ORAL | 0 refills | Status: AC
  Filled 2023-01-13: qty 30, 20d supply, fill #0

## 2023-01-13 MED ORDER — METHYLPREDNISOLONE ACETATE 40 MG/ML IJ SUSP
40.0000 mg | INTRAMUSCULAR | Status: AC | PRN
Start: 2023-01-13 — End: 2023-01-13
  Administered 2023-01-13: 40 mg via INTRA_ARTICULAR

## 2023-01-13 NOTE — Progress Notes (Signed)
Office Visit Note   Patient: Kristi Huynh           Date of Birth: [birth year]           MRN: [id number] Visit Date: 01/13/2023 Requested by: Dell Ponto, DO [address] [place],   [id number] PCP: Dell Ponto, DO  Subjective: Chief Complaint  Patient presents with   Right Knee - Follow-up    HPI: Kristi Huynh is a 57 y.o. female who presents to the office reporting right knee pain.  Since she was last seen she had an MRI scan which is reviewed with the patient today.  There is some abnormality around the posterior horn of the medial meniscus but no discrete meniscal root avulsion.  On the coronal view there is a flap of tissue which could be a partial tear of that posterior horn meniscal attachment site versus ligament of Wrisberg.  There is also some bruising versus geode in the medial femoral condyle primarily anteriorly.  No meniscal extrusion or obvious tear.  Patient does report pain on a daily basis and it does cause her to limp.  Has had some pain development on the lateral side of that knee radiating up the side of her thigh into her back region.  Denies much radiation down into the foot or lower leg.  No definite mechanical symptoms in the knee.  Most of her pain is in the posterior medial region.  Patient does not have a history of reflux..                ROS: All systems reviewed are negative as they relate to the chief complaint within the history of present illness.  Patient denies fevers or chills.  Assessment & Plan: Visit Diagnoses:  1. Acute pain of right knee   2. Synovitis of right knee     Plan: Impression is right knee pain with mild findings on MRI scanning.  No effusion.  With 6 weeks of symptoms I think injection in the knee is indicated.  If that does not help then workup of the back would be the next step particularly with recent onset of possible radicular symptoms in the proximal right leg.  Voltaren taper prescribed.  Injection  performed today.  Follow-up in 3 to 4 weeks or she could just let me know in the office how she is doing and we could proceed with workup on the back at that time.  Follow-Up Instructions: No follow-ups on file.   Orders:  No orders of the defined types were placed in this encounter.  Meds ordered this encounter  Medications   diclofenac (VOLTAREN) 75 MG EC tablet    Sig: Take 1 tablet  by mouth 2 (two) times daily for 5 days, THEN 1 tablet daily for 5 days, THEN 1 tablet daily IF needed.    Dispense:  30 tablet    Refill:  0      Procedures: Large Joint Inj: R knee on 01/13/2023 6:43 PM Indications: diagnostic evaluation, joint swelling and pain Details: 18 G 1.5 in needle, superolateral approach  Arthrogram: No  Medications: 5 mL lidocaine 1 %; 40 mg methylPREDNISolone acetate 40 MG/ML; 4 mL bupivacaine 0.25 % Outcome: tolerated well, no immediate complications Procedure, treatment alternatives, risks and benefits explained, specific risks discussed. Consent was given by the patient. Immediately prior to procedure a time out was called to verify the correct patient, procedure, equipment, support staff and site/side marked as required. Patient  was prepped and draped in the usual sterile fashion.       Clinical Data: No additional findings.  Objective: Vital Signs: LMP  (LMP Unknown)   Physical Exam:  Constitutional: Patient appears well-developed HEENT:  Head: Normocephalic Eyes:EOM are normal Neck: Normal range of motion Cardiovascular: Normal rate Pulmonary/chest: Effort normal Neurologic: Patient is alert Skin: Skin is warm Psychiatric: Patient has normal mood and affect  Ortho Exam: Ortho exam demonstrates slight antalgic gait to the right.  No effusion in the right knee.  No groin pain on the right with internal/external Tatian of the leg.  No masses lymphadenopathy or skin changes noted in that right knee region.  Range of motion is full.  Collateral crucial  ligaments are stable.  No definite focal joint line tenderness.  Specialty Comments:  No specialty comments available.  Imaging: No results found.   PMFS History: Patient Active Problem List   Diagnosis Date Noted   Situational stress 04/29/2019   Other insomnia 03/25/2017   At risk for diabetes mellitus 03/11/2017   Other hyperlipidemia 02/23/2017   Vitamin D deficiency 12/31/2016   Prediabetes 12/03/2016   Essential hypertension 11/04/2016   Constipation 11/04/2016   Class 2 obesity without serious comorbidity with body mass index (BMI) of 35.0 to 35.9 in adult 11/04/2016   ABSENCE OF MENSTRUATION 12/03/2009   WEIGHT GAIN 12/03/2009   FATIGUE 10/24/2008   Past Medical History:  Diagnosis Date   Anxiety    back pain    Depression    joint pain    Swelling    feet and legs    Family History  Problem Relation Age of Onset   Diabetes Mother    Hypertension Mother    Hyperlipidemia Mother    Thyroid disease Mother    Depression Mother    Anxiety disorder Mother    Eating disorder Mother    Obesity Mother    Hypercholesterolemia Mother    Heart disease Father    Sudden death Father 42       Aneurysm   Obesity Brother    Cancer Maternal Aunt    Breast cancer Maternal Aunt    Breast cancer Paternal Aunt    Cancer Paternal Aunt    Cancer Maternal Grandfather    Cancer - Colon Maternal Grandfather     Past Surgical History:  Procedure Laterality Date   ANKLE SURGERY     Right 2 times, once as teen and once at age 34   CYST REMOVAL HAND     Right hand age 42 and 73   LAPAROSCOPY     endometrosis   TONSILLECTOMY AND ADENOIDECTOMY     57 years old   Social History   Occupational History   Occupation: Clinical Patient Account Specialist  Tobacco Use   Smoking status: Never   Smokeless tobacco: Never  Substance and Sexual Activity   Alcohol use: Not on file   Drug use: Not on file   Sexual activity: Not on file

## 2023-01-27 ENCOUNTER — Other Ambulatory Visit (HOSPITAL_COMMUNITY): Payer: Self-pay

## 2023-01-27 MED ORDER — DOXYCYCLINE HYCLATE 100 MG PO CAPS
100.0000 mg | ORAL_CAPSULE | Freq: Two times a day (BID) | ORAL | 0 refills | Status: AC
  Filled 2023-01-27: qty 20, 10d supply, fill #0

## 2023-01-28 ENCOUNTER — Other Ambulatory Visit (HOSPITAL_COMMUNITY): Payer: Self-pay

## 2023-01-28 MED ORDER — PREDNISONE 10 MG PO TABS
10.0000 mg | ORAL_TABLET | ORAL | 0 refills | Status: DC
  Filled 2023-01-28: qty 42, 12d supply, fill #0

## 2023-02-11 ENCOUNTER — Other Ambulatory Visit (HOSPITAL_COMMUNITY): Payer: Self-pay

## 2023-02-11 ENCOUNTER — Ambulatory Visit (INDEPENDENT_AMBULATORY_CARE_PROVIDER_SITE_OTHER): Payer: Commercial Managed Care - PPO | Admitting: Orthopedic Surgery

## 2023-02-11 ENCOUNTER — Other Ambulatory Visit: Payer: Self-pay

## 2023-02-11 DIAGNOSIS — M25561 Pain in right knee: Secondary | ICD-10-CM

## 2023-02-11 MED ORDER — DICLOFENAC SODIUM 75 MG PO TBEC
75.0000 mg | DELAYED_RELEASE_TABLET | Freq: Every day | ORAL | 0 refills | Status: DC | PRN
  Filled 2023-02-11: qty 60, 60d supply, fill #0

## 2023-02-11 NOTE — Progress Notes (Unsigned)
Patients knee has flared up again and is having a new pain medially. Asking to try anti-inflammatory. Submitted diclofenac for her to pharmacy.

## 2023-02-13 ENCOUNTER — Encounter: Payer: Self-pay | Admitting: Orthopedic Surgery

## 2023-02-13 NOTE — Progress Notes (Signed)
Post-Op Visit Note   Patient: Kristi Huynh           Date of Birth: [birth year]           MRN: [id number] Visit Date: 02/11/2023 PCP: Glennis Brink, DO   Assessment & Plan:  Chief Complaint:  Chief Complaint  Patient presents with   Right Knee - Pain   Visit Diagnoses:  1. Acute pain of right knee     Plan: I saw Porter in the hall.  She was doing great from her last right knee injection until she hit the stair wrong several days ago.  It has been painful and medial side since then.  Looked at her briefly in the hall and she has many occasion but definitely some medial sided tenderness but stable MCL.  I think this could be a bone bruise or meniscal pathology without effusion.  She is going to give it a week and rest and then come in for formal evaluation and possible repeat MRI scanning if it has not improved.  It is bothering her to the point where it is very painful to weight-bear.  Again without any effusion and full range of motion I think it is unlikely that she has any type of fracture and her ligaments do feel stable.  Follow-Up Instructions: No follow-ups on file.   Orders:  No orders of the defined types were placed in this encounter.  No orders of the defined types were placed in this encounter.   Imaging: No results found.  PMFS History: Patient Active Problem List   Diagnosis Date Noted   Situational stress 04/29/2019   Other insomnia 03/25/2017   At risk for diabetes mellitus 03/11/2017   Other hyperlipidemia 02/23/2017   Vitamin D deficiency 12/31/2016   Prediabetes 12/03/2016   Essential hypertension 11/04/2016   Constipation 11/04/2016   Class 2 obesity without serious comorbidity with body mass index (BMI) of 35.0 to 35.9 in adult 11/04/2016   ABSENCE OF MENSTRUATION 12/03/2009   WEIGHT GAIN 12/03/2009   FATIGUE 10/24/2008   Past Medical History:  Diagnosis Date   Anxiety    back pain    Depression    joint pain    Swelling    feet and  legs    Family History  Problem Relation Age of Onset   Diabetes Mother    Hypertension Mother    Hyperlipidemia Mother    Thyroid disease Mother    Depression Mother    Anxiety disorder Mother    Eating disorder Mother    Obesity Mother    Hypercholesterolemia Mother    Heart disease Father    Sudden death Father 65       Aneurysm   Obesity Brother    Cancer Maternal Aunt    Breast cancer Maternal Aunt    Breast cancer Paternal Aunt    Cancer Paternal Aunt    Cancer Maternal Grandfather    Cancer - Colon Maternal Grandfather     Past Surgical History:  Procedure Laterality Date   ANKLE SURGERY     Right 2 times, once as teen and once at age 13   CYST REMOVAL HAND     Right hand age 4 and 21   LAPAROSCOPY     endometrosis   TONSILLECTOMY AND ADENOIDECTOMY     57 years old   Social History   Occupational History   Occupation: Clinical Patient Account Specialist  Tobacco Use   Smoking status: Never  Smokeless tobacco: Never  Substance and Sexual Activity   Alcohol use: Not on file   Drug use: Not on file   Sexual activity: Not on file

## 2023-02-16 NOTE — Progress Notes (Signed)
thx

## 2023-05-18 ENCOUNTER — Other Ambulatory Visit: Payer: Self-pay | Admitting: Family Medicine

## 2023-05-18 DIAGNOSIS — Z780 Asymptomatic menopausal state: Secondary | ICD-10-CM

## 2023-05-28 ENCOUNTER — Other Ambulatory Visit (HOSPITAL_COMMUNITY): Payer: Self-pay

## 2023-05-28 ENCOUNTER — Other Ambulatory Visit: Payer: Self-pay | Admitting: Orthopedic Surgery

## 2023-05-28 MED ORDER — PREDNISONE 10 MG PO TABS
10.0000 mg | ORAL_TABLET | Freq: Every day | ORAL | 0 refills | Status: DC
  Filled 2023-05-28: qty 30, 30d supply, fill #0

## 2023-08-19 DIAGNOSIS — Z01419 Encounter for gynecological examination (general) (routine) without abnormal findings: Secondary | ICD-10-CM | POA: Diagnosis not present

## 2023-08-19 DIAGNOSIS — Z1231 Encounter for screening mammogram for malignant neoplasm of breast: Secondary | ICD-10-CM | POA: Diagnosis not present

## 2023-09-04 ENCOUNTER — Telehealth: Payer: Self-pay

## 2023-09-04 ENCOUNTER — Other Ambulatory Visit (HOSPITAL_COMMUNITY): Payer: Self-pay

## 2023-09-04 ENCOUNTER — Other Ambulatory Visit: Payer: Self-pay | Admitting: Surgical

## 2023-09-04 MED ORDER — DICLOFENAC SODIUM 75 MG PO TBEC
75.0000 mg | DELAYED_RELEASE_TABLET | Freq: Every day | ORAL | 0 refills | Status: AC | PRN
  Filled 2023-09-04: qty 60, 60d supply, fill #0

## 2023-09-04 NOTE — Telephone Encounter (Signed)
Advised pt

## 2023-09-04 NOTE — Telephone Encounter (Signed)
Refilled diclofenac

## 2023-09-04 NOTE — Telephone Encounter (Signed)
Patient requesting anti-inflammatory sent to [facility]. She is having some elbow pain after doing a lot of painting.

## 2023-09-15 ENCOUNTER — Encounter: Payer: Self-pay | Admitting: Sports Medicine

## 2023-09-15 ENCOUNTER — Ambulatory Visit: Payer: Commercial Managed Care - PPO | Admitting: Sports Medicine

## 2023-09-15 ENCOUNTER — Other Ambulatory Visit (INDEPENDENT_AMBULATORY_CARE_PROVIDER_SITE_OTHER): Payer: Commercial Managed Care - PPO

## 2023-09-15 ENCOUNTER — Other Ambulatory Visit (HOSPITAL_COMMUNITY): Payer: Self-pay

## 2023-09-15 DIAGNOSIS — M25521 Pain in right elbow: Secondary | ICD-10-CM

## 2023-09-15 DIAGNOSIS — M7711 Lateral epicondylitis, right elbow: Secondary | ICD-10-CM | POA: Diagnosis not present

## 2023-09-15 MED ORDER — METHYLPREDNISOLONE 4 MG PO TBPK
ORAL_TABLET | ORAL | 0 refills | Status: AC
  Filled 2023-09-15: qty 21, 6d supply, fill #0

## 2023-09-15 NOTE — Progress Notes (Signed)
Kristi Huynh - 57 y.o. female MRN [id number]  Date of birth: 1966/05/18  Office Visit Note: Visit Date: 09/15/2023 PCP: Kristi Brink, DO Referred by: Huynh, Kristi Jun, DO  Subjective: Chief Complaint  Patient presents with   Right Elbow - Pain   HPI: Kristi Huynh is a pleasant 57 y.o. female who presents today for right elbow pain.  She does write left-handed but does every other activity right-handed.  She has had elbow pain over the last few weeks after she put 2 separate codes of paint on her basement floor as well as doing a lot of lifting and moving boxes with repetitive activity.  Following that she has had pain over the lateral aspect of the elbow that will radiate slightly proximally and distally down the forearm musculature.  She denies any numbness tingling or gross weakness.  She did just begin taking oral Voltaren 75 mg once daily as needed but has not taken this consistently yet.   Pertinent ROS were reviewed with the patient and found to be negative unless otherwise specified above in HPI.   Assessment & Plan: Visit Diagnoses:  1. Lateral epicondylitis, right elbow   2. Pain in right elbow    Plan: Discussed with Kristi Huynh that she is dealing with a degree of lateral epicondylitis with overuse syndrome given her recurrent repetitive activity with painting and to a lesser degree moving/boxes.  We need to improve the inflammation stage currently, we will place her on a 6-day course of Medrol Dosepak, following this she may start her oral Voltaren 75 mg once to twice daily for 1 additional week and then discontinue to as needed use only.  I did give her some stretches to perform for her, wrist extensors.  She also will purchase an over-the-counter counterforce strap to wear with activity.  Discussed activity modification and rest for the first few weeks.  Would also benefit from icing the area once to twice daily for 15-20 minutes.  Will see how she feels over the next few  weeks, may consider upgrading her rehab to more lateral epicondylitis strengthening and stabilizing exercises in the future.  Depending on the degree of improvement, discussed future treatment considerations including extracorporeal shockwave therapy or consideration of US-guided LE injection.   Follow-up: Return in about 3 weeks (around 10/06/2023), or if symptoms worsen or fail to improve.   Meds & Orders:  Meds ordered this encounter  Medications   methylPREDNISolone (MEDROL) 4 MG TBPK tablet    Sig: Take as directed for 6 days, taper dosing    Dispense:  21 each    Refill:  0    Orders Placed This Encounter  Procedures   XR Elbow 2 Views Right     Procedures: No procedures performed      Clinical History: No specialty comments available.  She reports that she has never smoked. She has never used smokeless tobacco. No results for input(s): "HGBA1C", "LABURIC" in the last 8760 hours.  Objective:   Vital Signs: LMP  (LMP Unknown)   Physical Exam  Gen: Well-appearing, in no acute distress; non-toxic CV: Well-perfused. Warm.  Resp: Breathing unlabored on room air; no wheezing. Psych: Fluid speech in conversation; appropriate affect; normal thought process Neuro: Sensation intact throughout. No gross coordination deficits.   Ortho Exam -Right elbow: TTP at and just distal to the lateral epicondyle.  There is full range of motion about the elbow without restriction.  No varus or valgus instability.  Positive [employer]  test, pain with resisted extension about the wrist.  Imaging: XR Elbow 2 Views Right  Result Date: 09/15/2023 2 views of the right elbow including AP and lateral film were ordered and reviewed by myself.  There is a well-maintained elbow joint.  There is a very small cortical spur off the superior aspect of the radial head/coronoid process.  No acute fracture or otherwise bony abnormality noted.   Past Medical/Family/Surgical/Social History: Medications &  Allergies reviewed per EMR, new medications updated. Patient Active Problem List   Diagnosis Date Noted   Situational stress 04/29/2019   Other insomnia 03/25/2017   At risk for diabetes mellitus 03/11/2017   Other hyperlipidemia 02/23/2017   Vitamin D deficiency 12/31/2016   Prediabetes 12/03/2016   Essential hypertension 11/04/2016   Constipation 11/04/2016   Class 2 obesity without serious comorbidity with body mass index (BMI) of 35.0 to 35.9 in adult 11/04/2016   ABSENCE OF MENSTRUATION 12/03/2009   WEIGHT GAIN 12/03/2009   FATIGUE 10/24/2008   Past Medical History:  Diagnosis Date   Anxiety    back pain    Depression    joint pain    Swelling    feet and legs   Family History  Problem Relation Age of Onset   Diabetes Mother    Hypertension Mother    Hyperlipidemia Mother    Thyroid disease Mother    Depression Mother    Anxiety disorder Mother    Eating disorder Mother    Obesity Mother    Hypercholesterolemia Mother    Heart disease Father    Sudden death Father 24       Aneurysm   Obesity Brother    Cancer Maternal Aunt    Breast cancer Maternal Aunt    Breast cancer Paternal Aunt    Cancer Paternal Aunt    Cancer Maternal Grandfather    Cancer - Colon Maternal Grandfather    Past Surgical History:  Procedure Laterality Date   ANKLE SURGERY     Right 2 times, once as teen and once at age 85   CYST REMOVAL HAND     Right hand age 9 and 63   LAPAROSCOPY     endometrosis   TONSILLECTOMY AND ADENOIDECTOMY     57 years old   Social History   Occupational History   Occupation: Clinical Patient Account Specialist  Tobacco Use   Smoking status: Never   Smokeless tobacco: Never  Substance and Sexual Activity   Alcohol use: Not on file   Drug use: Not on file   Sexual activity: Not on file

## 2023-10-16 ENCOUNTER — Ambulatory Visit (INDEPENDENT_AMBULATORY_CARE_PROVIDER_SITE_OTHER): Payer: Commercial Managed Care - PPO | Admitting: Sports Medicine

## 2023-10-16 ENCOUNTER — Other Ambulatory Visit: Payer: Self-pay

## 2023-10-16 ENCOUNTER — Encounter: Payer: Self-pay | Admitting: Sports Medicine

## 2023-10-16 DIAGNOSIS — M7711 Lateral epicondylitis, right elbow: Secondary | ICD-10-CM

## 2023-10-16 DIAGNOSIS — S56511A Strain of other extensor muscle, fascia and tendon at forearm level, right arm, initial encounter: Secondary | ICD-10-CM

## 2023-10-16 NOTE — Progress Notes (Signed)
   Procedure Note  Patient: Kristi Huynh             Date of Birth: August 19, 1966           MRN: [id number]             Visit Date: 10/16/2023  Imaging:  US  Guided Needle Placement - No Linked Charges Limited musculoskeletal ultrasound of the right upper extremity, right  elbow was performed today.  X-ray demonstrates the lateral epicondyle and  radial head without cortical regularity.  There is no elbow effusion.  The  common extensor tendon origin was identified with mixed areas of  hypoechoic change indicative of at least moderate tendinosis.  There is a  degree of what appears to be interstitial tearing near the proximal  origin.  With the injection the tenderness fascial layers slightly spread  indicative of likely partial interstitial tearing.  There is no  full-thickness tearing or tendon retraction.  *Technically successful ultrasound-guided lateral epicondyle (CET)  injection. US  Extrem Up Right Ltd Limited musculoskeletal ultrasound of the right upper extremity, right  elbow was performed today.  X-ray demonstrates the lateral epicondyle and  radial head without cortical regularity.  There is no elbow effusion.  The  common extensor tendon origin was identified with mixed areas of  hypoechoic change indicative of at least moderate tendinosis.  There is a  degree of what appears to be interstitial tearing near the proximal  origin.  With the injection the tenderness fascial layers slightly spread  indicative of likely partial interstitial tearing.  There is no  full-thickness tearing or tendon retraction.  *Technically successful ultrasound-guided lateral epicondyle (CET)  injection.    Procedures: Visit Diagnoses:  1. Lateral epicondylitis, right elbow   2. Partial tear of common extensor tendon of right elbow    Procedure: Lateral Epicondylitis (Common Extensor Tendon) Injection, Right Elbow After informed verbal consent was obtained, a timeout was performed.   Patient was lying supine on exam table.  Area overlying the affected lateral epicondyle was prepped with chloraprep and alcohol swabs. Then utilizing ultrasound guidance via an in-plane approach, the patient's lateral epicondyle and common extensor tendon was injected with 1:1:1 lidocaine :bupivicaine:betamethasone with multiple needle fenestrations from a distal to proximal direction. Patient tolerated procedure well without immediate complications.  -Patient tolerated well, may use ice as well as her oral Voltaren  twice daily for the next 48 hours. -Commended 48 to 72 hours of modified rest/activity and then the return as instructed.  She may resume her home exercise protocol starting on Monday. -She will notify me in the office of her improvement over the next few weeks  Lonell Sprang, DO Primary Care Sports Medicine Physician  [facility]  This note was dictated using Dragon naturally speaking software and may contain errors in syntax, spelling, or content which have not been identified prior to signing this note.

## 2023-12-21 ENCOUNTER — Other Ambulatory Visit (HOSPITAL_COMMUNITY): Payer: Self-pay

## 2023-12-21 MED ORDER — BENZONATATE 100 MG PO CAPS
100.0000 mg | ORAL_CAPSULE | Freq: Three times a day (TID) | ORAL | 3 refills | Status: AC | PRN
  Filled 2023-12-21: qty 60, 10d supply, fill #0

## 2023-12-21 MED ORDER — ALBUTEROL SULFATE HFA 108 (90 BASE) MCG/ACT IN AERS
2.0000 | INHALATION_SPRAY | RESPIRATORY_TRACT | 6 refills | Status: AC | PRN
  Filled 2023-12-21: qty 6.7, 17d supply, fill #0

## 2024-01-29 ENCOUNTER — Ambulatory Visit
Admission: RE | Admit: 2024-01-29 | Discharge: 2024-01-29 | Disposition: A | Discharge status: Home / Self Care | Payer: Commercial Managed Care - PPO | Source: Ambulatory Visit | Attending: Family Medicine | Admitting: Family Medicine

## 2024-01-29 DIAGNOSIS — Z780 Asymptomatic menopausal state: Secondary | ICD-10-CM

## 2024-01-29 DIAGNOSIS — N958 Other specified menopausal and perimenopausal disorders: Secondary | ICD-10-CM | POA: Diagnosis not present

## 2024-01-29 DIAGNOSIS — E2839 Other primary ovarian failure: Secondary | ICD-10-CM | POA: Diagnosis not present

## 2024-05-27 ENCOUNTER — Encounter: Payer: Self-pay | Admitting: Sports Medicine

## 2024-05-27 ENCOUNTER — Other Ambulatory Visit: Payer: Self-pay

## 2024-05-27 ENCOUNTER — Ambulatory Visit: Admitting: Sports Medicine

## 2024-05-27 DIAGNOSIS — S56511A Strain of other extensor muscle, fascia and tendon at forearm level, right arm, initial encounter: Secondary | ICD-10-CM

## 2024-05-27 DIAGNOSIS — M25521 Pain in right elbow: Secondary | ICD-10-CM

## 2024-05-27 DIAGNOSIS — M7711 Lateral epicondylitis, right elbow: Secondary | ICD-10-CM

## 2024-05-27 NOTE — Progress Notes (Addendum)
   Procedure Note  Patient: Kristi Huynh             Date of Birth: 08-08-1966           MRN: [id number]             Visit Date: 05/27/2024  Procedures: Visit Diagnoses:  1. Lateral epicondylitis, right elbow   2. Pain in right elbow   3. Partial tear of common extensor tendon of right elbow    Procedure: Lateral Epicondylitis (Common Extensor Tendon) Platelet-Rich-Plasma Injection, Right Elbow After informed verbal consent was obtained, a timeout was performed. Patient was lying supine on exam table with the elbow in 90 degree of flexion. The area overlying the affected lateral epicondyle was prepped with chloraprep and alcohol swabs. Then using ultrasound guidance via an in-plane approach, a 22G, 1.5 needle was inserted from a distal to proximal direction into the area of the lateral epicondyle and common extensor tendon origin and subsequently injected with 5.25 cc of platelet-rich-plasma (leukocyte-rich). Visualization of injectate spread within the common extensor tendons was visualized dynamically under ultrasound guidance with multiple needle fenestrations. Patient tolerated the procedure well without immediate complications    Kit: RegenLab: RegenPlasma; THT-3 kit   *Post-PRP Injection Guidelines: No anti-inflammatories (ibuprofen/motrin, aleve, meloxicam, etc.) for 2 weeks.  No ice for 2 weeks.  Short prescription of tramadol may be written if pain is severe, call if needed. Appropriate rest / bracing / and timeframe for beginning therapy discussed and patient endorsed understanding. Patient was fitted for a shoulder sling to be used as needed for short-term only.  Lonell Sprang, DO Primary Care Sports Medicine Physician  [facility]  This note was dictated using Dragon naturally speaking software and may contain errors in syntax, spelling, or content which have not been identified prior to signing this note.

## 2024-06-09 ENCOUNTER — Other Ambulatory Visit: Payer: Self-pay | Admitting: Family Medicine

## 2024-06-09 DIAGNOSIS — R7401 Elevation of levels of liver transaminase levels: Secondary | ICD-10-CM

## 2024-06-10 ENCOUNTER — Encounter: Payer: Self-pay | Admitting: Family Medicine

## 2024-06-14 ENCOUNTER — Ambulatory Visit
Admission: RE | Admit: 2024-06-14 | Discharge: 2024-06-14 | Disposition: A | Discharge status: Home / Self Care | Source: Ambulatory Visit | Attending: Family Medicine | Admitting: Family Medicine

## 2024-06-14 DIAGNOSIS — K760 Fatty (change of) liver, not elsewhere classified: Secondary | ICD-10-CM | POA: Diagnosis not present

## 2024-06-14 DIAGNOSIS — R7401 Elevation of levels of liver transaminase levels: Secondary | ICD-10-CM

## 2024-06-26 DIAGNOSIS — Z1211 Encounter for screening for malignant neoplasm of colon: Secondary | ICD-10-CM | POA: Diagnosis not present

## 2024-08-15 ENCOUNTER — Encounter: Payer: Self-pay | Admitting: Radiology

## 2024-08-22 DIAGNOSIS — Z1231 Encounter for screening mammogram for malignant neoplasm of breast: Secondary | ICD-10-CM | POA: Diagnosis not present

## 2024-08-22 DIAGNOSIS — Z780 Asymptomatic menopausal state: Secondary | ICD-10-CM | POA: Diagnosis not present

## 2024-08-22 DIAGNOSIS — R3915 Urgency of urination: Secondary | ICD-10-CM | POA: Diagnosis not present

## 2024-08-22 DIAGNOSIS — Z01419 Encounter for gynecological examination (general) (routine) without abnormal findings: Secondary | ICD-10-CM | POA: Diagnosis not present
